# Patient Record
Sex: Female | Born: 1955
Health system: Southern US, Community
[De-identification: ages and names within clinical notes are randomized; demographics above are authoritative.]

## PROBLEM LIST (undated history)

## (undated) DIAGNOSIS — I10 Essential (primary) hypertension: Secondary | ICD-10-CM

## (undated) DIAGNOSIS — E785 Hyperlipidemia, unspecified: Secondary | ICD-10-CM

## (undated) DIAGNOSIS — F419 Anxiety disorder, unspecified: Secondary | ICD-10-CM

## (undated) DIAGNOSIS — K219 Gastro-esophageal reflux disease without esophagitis: Secondary | ICD-10-CM

## (undated) HISTORY — DX: Essential (primary) hypertension: I10

## (undated) HISTORY — DX: Anxiety disorder, unspecified: F41.9

## (undated) HISTORY — PX: ECTOPIC PREGNANCY SURGERY: SHX613

## (undated) HISTORY — PX: TUBAL LIGATION: SHX77

---

## 1997-05-15 ENCOUNTER — Other Ambulatory Visit: Admission: RE | Admit: 1997-05-15 | Discharge: 1997-05-15 | Payer: Self-pay | Admitting: Obstetrics & Gynecology

## 1998-11-25 ENCOUNTER — Other Ambulatory Visit: Admission: RE | Admit: 1998-11-25 | Discharge: 1998-11-25 | Payer: Self-pay | Admitting: Obstetrics & Gynecology

## 1999-12-01 ENCOUNTER — Other Ambulatory Visit: Admission: RE | Admit: 1999-12-01 | Discharge: 1999-12-01 | Payer: Self-pay | Admitting: Obstetrics & Gynecology

## 2001-05-30 ENCOUNTER — Other Ambulatory Visit: Admission: RE | Admit: 2001-05-30 | Discharge: 2001-05-30 | Payer: Self-pay | Admitting: Obstetrics & Gynecology

## 2002-11-28 ENCOUNTER — Other Ambulatory Visit: Admission: RE | Admit: 2002-11-28 | Discharge: 2002-11-28 | Payer: Self-pay | Admitting: Obstetrics & Gynecology

## 2004-04-28 ENCOUNTER — Other Ambulatory Visit: Admission: RE | Admit: 2004-04-28 | Discharge: 2004-04-28 | Payer: Self-pay | Admitting: Obstetrics & Gynecology

## 2008-08-01 ENCOUNTER — Ambulatory Visit: Payer: Self-pay | Admitting: Cardiology

## 2008-08-12 ENCOUNTER — Ambulatory Visit: Payer: Self-pay | Admitting: Cardiology

## 2008-08-13 ENCOUNTER — Encounter: Payer: Self-pay | Admitting: Physician Assistant

## 2008-08-19 ENCOUNTER — Inpatient Hospital Stay (HOSPITAL_BASED_OUTPATIENT_CLINIC_OR_DEPARTMENT_OTHER): Admission: RE | Admit: 2008-08-19 | Discharge: 2008-08-19 | Payer: Self-pay | Admitting: Cardiology

## 2008-08-19 ENCOUNTER — Ambulatory Visit: Payer: Self-pay | Admitting: Cardiology

## 2008-08-26 ENCOUNTER — Telehealth: Payer: Self-pay | Admitting: Cardiology

## 2008-11-12 DIAGNOSIS — I10 Essential (primary) hypertension: Secondary | ICD-10-CM | POA: Insufficient documentation

## 2008-11-12 DIAGNOSIS — R079 Chest pain, unspecified: Secondary | ICD-10-CM | POA: Insufficient documentation

## 2010-06-22 NOTE — Cardiovascular Report (Signed)
NAMENEGIN, HEGG NO.:  [id number]   MEDICAL RECORD NO.:  [id number]          PATIENT TYPE:  OIB   LOCATION:  1961                         FACILITY:  [facility]   PHYSICIAN:  Rollene Rotunda, MD, FACCDATE OF BIRTH:  Sep 09, 1955   DATE OF PROCEDURE:  08/19/2008  DATE OF DISCHARGE:  08/19/2008                            CARDIAC CATHETERIZATION   PROCEDURE:  Left heart catheterization/coronary arteriography.   INDICATIONS:  Evaluate the patient with chest discomfort and an abnormal  stress echo.   PROCEDURE NOTE:  Left heart catheterization was performed via the right  femoral artery.  The artery was cannulated using an anterior wall  puncture.  A #4-French arterial sheath was inserted via the modified  Seldinger technique.  Preformed Judkins and pigtail catheter were  utilized.  The patient tolerated the procedure well and left the lab in  stable condition.   RESULTS:  1. Hemodynamics:  LV 142/ 11, AO 142/68.  2. Coronaries:  Left main was normal.  The LAD was a large vessel      wrapping the apex.  It was tortuous in the apical segment.  It was      normal throughout its course.  There was a mid-diagonal which was      large and normal.  The circumflex in the AV groove was normal.      There was a mid obtuse marginal, which was large and normal.  The      right coronary artery was a large dominant vessel.  It was normal      throughout its course.  The PDA was moderate size and normal.  The      posterolateral was small and normal.  3. Left ventriculogram.  The left ventriculogram was obtained in the      RAO projection.  The EF was 70% with normal wall motion.  There was      ventricular ectopy.   CONCLUSION:  1. Normal coronary arteries.  2. Normal left ventricular function.   PLAN:  No further cardiovascular workup is suggested.  The patient will  continue with primary risk reduction.  She will see Dr. Dimas Aguas for any  further evaluation of nonanginal  chest discomfort.      Rollene Rotunda, MD, [facility]  Electronically Signed     JH/MEDQ  D:  08/19/2008  T:  08/19/2008  Job:  [id number]   cc:   Selinda Flavin

## 2010-06-22 NOTE — Assessment & Plan Note (Signed)
[facility]                          [facility] OFFICE NOTE   BRIANCA, FORTENBERRY                       MRN:          [id number]  DATE:08/12/2008                            DOB:          11-17-1955    PRIMARY CARE PHYSICIAN:  Dr. Selinda Flavin.   REASON FOR PRESENTATION:  Evaluate the patient with chest pain and an  abnormal stress test.   HISTORY OF PRESENT ILLNESS:  The patient reports that on July 17, 2008,  she had an episode of chest discomfort.  It was a shooting discomfort.  It lasted for only a few seconds.  She never had anything like this  before.  She did not describe associated nausea, vomiting, or  diaphoresis.  She did not have any palpitations, presyncope, or syncope.  She was not short of breath with this.  It happened at rest.  It went  away spontaneously.  She did see Dr. Dimas Aguas.  She elected to have a  stress test and had a stress echocardiogram.  This demonstrated 1-mm ST  depression in the inferior leads with exercise.  She had septal  hypokinesis suggestive of ischemia.  She exercised for 4 minutes and 49  seconds before achieving her target heart rate.   The patient otherwise says that she is active, but she does not  exercise.  She goes up and down stairs.  If she walks a hill in her  yard, she will get short of breath.  She does not get chest pressure, or  arm discomfort.  She does get some left neck discomfort which she has  had sporadically over the years.  However, this does not seem to happen  with exertion.   PAST MEDICAL HISTORY:  The patient has white coat hypertension.  She  has no history of dyslipidemia.  She has no history of diabetes.   PAST SURGICAL HISTORY:  Surgical treatment of ectopic pregnancy and  tubal ligation.   ALLERGIES/INTOLERANCES:  AMOXICILLIN, CECLOR, SULFA, KETAC, NORGESIC  FORTE.   MEDICATIONS:  Multivitamin, calcium, magnesium, fish oil, vitamin C,  aspirin 81 mg daily.   SOCIAL  HISTORY:  The patient is married.  She does not smoke cigarettes  or drink alcohol.   Family history is contributory for mother dying suddenly at age 57,  questionably of a heart attack.  She has a brother with a stroke at age  47.   REVIEW OF SYSTEMS:  As stated in the HPI, positive for decreased  hearing, occasional headaches, occasional reflux, eczema, anxiety.  Negative for all other systems.   PHYSICAL EXAMINATION:  GENERAL:  The patient is in no distress.  VITAL SIGNS:  Blood pressure 179/87, heart rate 88 and regular, weight  138 pounds.  HEENT:  Eyelids unremarkable, pupils round and reactive to light, fundi  not visualized, oral mucosa unremarkable.  NECK:  No jugular venous distention at 45 degrees, normal carotid  upstroke, brisk and symmetric, no bruits, no thyromegaly.  LYMPHATICS:  No cervical, axillary, or inguinal adenopathy.  LUNGS:  Clear to auscultation bilaterally.  BACK:  No costovertebral angle tenderness.  CHEST:  Unremarkable.  HEART:  PMI not displaced or sustained, S1 and S2 within normal limits,  no S3, no S4, no clicks, no rubs, no murmurs.  ABDOMEN:  Flat, positive bowel sounds, normal in frequency and pitch, no  bruits, no rebound, no guarding, no midline pulsatile mass, no  hepatomegaly, no splenomegaly.  SKIN:  No rashes, no nodules.  EXTREMITIES:  Pulses 2+ throughout, no edema, no cyanosis, no clubbing.  NEUROLOGIC:  Oriented to person, place, and time, cranial nerves II  through XII grossly intact, motor grossly intact.   ASSESSMENT AND PLAN:  1. Chest discomfort.  The patient's chest discomfort was atypical.      However, she did have an abnormal stress test.  She has family      history.  We discussed the possibilities for further evaluation to      include further noninvasive testing versus cardiac catheterization.      At this point given the abnormal response and her risk factors, I      would favor cardiac catheterization.  We discussed  at length the      risks and benefits.  She is going to consider this and call us back      with her decision.  2. Hypertension.  Her blood pressure is elevated.  I will ask her to      get a blood pressure cuff and keep an eye on this at home.  The      goal should be 140/90.  If not, she will need medical management.  3. Risk reduction.  I did look over her lipid profile.  Her HDL is 58      and an LDL of 95.  She will continue with dietary management.  4. Anxiety per Dr. Dimas Aguas.   FOLLOWUP:  We will see the patient back for further evaluation based on  her consideration of our discussion.     Rollene Rotunda, MD, [facility]  Electronically Signed    JH/MedQ  DD: 08/12/2008  DT: 08/12/2008  Job #: [id number]   cc:   Selinda Flavin

## 2014-05-11 ENCOUNTER — Encounter (HOSPITAL_COMMUNITY): Payer: Self-pay | Admitting: Cardiology

## 2014-05-11 ENCOUNTER — Observation Stay (HOSPITAL_COMMUNITY)
Admission: EM | Admit: 2014-05-11 | Discharge: 2014-05-12 | Disposition: A | Discharge status: Home / Self Care | Payer: BLUE CROSS/BLUE SHIELD | Attending: Emergency Medicine | Admitting: Emergency Medicine

## 2014-05-11 ENCOUNTER — Emergency Department (HOSPITAL_COMMUNITY): Payer: BLUE CROSS/BLUE SHIELD

## 2014-05-11 DIAGNOSIS — R42 Dizziness and giddiness: Secondary | ICD-10-CM | POA: Diagnosis not present

## 2014-05-11 DIAGNOSIS — Z79899 Other long term (current) drug therapy: Secondary | ICD-10-CM | POA: Diagnosis not present

## 2014-05-11 DIAGNOSIS — S060XAA Concussion with loss of consciousness status unknown, initial encounter: Secondary | ICD-10-CM | POA: Diagnosis present

## 2014-05-11 DIAGNOSIS — W01198A Fall on same level from slipping, tripping and stumbling with subsequent striking against other object, initial encounter: Secondary | ICD-10-CM | POA: Diagnosis not present

## 2014-05-11 DIAGNOSIS — I10 Essential (primary) hypertension: Secondary | ICD-10-CM | POA: Insufficient documentation

## 2014-05-11 DIAGNOSIS — R252 Cramp and spasm: Secondary | ICD-10-CM | POA: Diagnosis present

## 2014-05-11 DIAGNOSIS — S0990XA Unspecified injury of head, initial encounter: Secondary | ICD-10-CM | POA: Diagnosis not present

## 2014-05-11 DIAGNOSIS — Z7982 Long term (current) use of aspirin: Secondary | ICD-10-CM | POA: Insufficient documentation

## 2014-05-11 DIAGNOSIS — Z880 Allergy status to penicillin: Secondary | ICD-10-CM | POA: Diagnosis not present

## 2014-05-11 DIAGNOSIS — S060X9A Concussion with loss of consciousness of unspecified duration, initial encounter: Secondary | ICD-10-CM | POA: Diagnosis present

## 2014-05-11 DIAGNOSIS — Y9289 Other specified places as the place of occurrence of the external cause: Secondary | ICD-10-CM | POA: Diagnosis not present

## 2014-05-11 DIAGNOSIS — Y998 Other external cause status: Secondary | ICD-10-CM | POA: Insufficient documentation

## 2014-05-11 DIAGNOSIS — W19XXXA Unspecified fall, initial encounter: Secondary | ICD-10-CM

## 2014-05-11 DIAGNOSIS — Y9389 Activity, other specified: Secondary | ICD-10-CM | POA: Diagnosis not present

## 2014-05-11 DIAGNOSIS — F419 Anxiety disorder, unspecified: Secondary | ICD-10-CM | POA: Diagnosis not present

## 2014-05-11 DIAGNOSIS — R55 Syncope and collapse: Secondary | ICD-10-CM | POA: Diagnosis not present

## 2014-05-11 DIAGNOSIS — S0083XA Contusion of other part of head, initial encounter: Secondary | ICD-10-CM | POA: Diagnosis present

## 2014-05-11 HISTORY — DX: Hyperlipidemia, unspecified: E78.5

## 2014-05-11 LAB — CBC WITH DIFFERENTIAL/PLATELET
Basophils Absolute: 0 10*3/uL (ref 0.0–0.1)
Basophils Relative: 0 % (ref 0–1)
EOS PCT: 0 % (ref 0–5)
Eosinophils Absolute: 0 10*3/uL (ref 0.0–0.7)
HEMATOCRIT: 40.3 % (ref 36.0–46.0)
Hemoglobin: 13.8 g/dL (ref 12.0–15.0)
LYMPHS PCT: 10 % — AB (ref 12–46)
Lymphs Abs: 0.9 10*3/uL (ref 0.7–4.0)
MCH: 30.1 pg (ref 26.0–34.0)
MCHC: 34.2 g/dL (ref 30.0–36.0)
MCV: 87.8 fL (ref 78.0–100.0)
Monocytes Absolute: 0.6 10*3/uL (ref 0.1–1.0)
Monocytes Relative: 6 % (ref 3–12)
NEUTROS ABS: 7.9 10*3/uL — AB (ref 1.7–7.7)
NEUTROS PCT: 84 % — AB (ref 43–77)
Platelets: 178 10*3/uL (ref 150–400)
RBC: 4.59 MIL/uL (ref 3.87–5.11)
RDW: 12.6 % (ref 11.5–15.5)
WBC: 9.4 10*3/uL (ref 4.0–10.5)

## 2014-05-11 LAB — BASIC METABOLIC PANEL
Anion gap: 7 (ref 5–15)
BUN: 15 mg/dL (ref 6–23)
CHLORIDE: 108 mmol/L (ref 96–112)
CO2: 23 mmol/L (ref 19–32)
CREATININE: 0.59 mg/dL (ref 0.50–1.10)
Calcium: 8.6 mg/dL (ref 8.4–10.5)
Glucose, Bld: 112 mg/dL — ABNORMAL HIGH (ref 70–99)
POTASSIUM: 3.7 mmol/L (ref 3.5–5.1)
Sodium: 138 mmol/L (ref 135–145)

## 2014-05-11 LAB — TSH: TSH: 2.42 u[IU]/mL (ref 0.350–4.500)

## 2014-05-11 LAB — VITAMIN B12: Vitamin B-12: 677 pg/mL (ref 211–911)

## 2014-05-11 LAB — T4, FREE: Free T4: 1.42 ng/dL (ref 0.80–1.80)

## 2014-05-11 MED ORDER — ALUM & MAG HYDROXIDE-SIMETH 200-200-20 MG/5ML PO SUSP: 30.0000 mL | Freq: Four times a day (QID) | ORAL | Status: DC | PRN

## 2014-05-11 MED ORDER — VITAMIN C 500 MG PO TABS
500.0000 mg | ORAL_TABLET | Freq: Every day | ORAL | Status: DC
  Administered 2014-05-12: 500 mg via ORAL
  Filled 2014-05-11: qty 1

## 2014-05-11 MED ORDER — ACETAMINOPHEN 325 MG PO TABS: 650.0000 mg | ORAL_TABLET | Freq: Four times a day (QID) | ORAL | Status: DC | PRN

## 2014-05-11 MED ORDER — GUAIFENESIN-DM 100-10 MG/5ML PO SYRP
5.0000 mL | ORAL_SOLUTION | ORAL | Status: DC | PRN
Start: 2014-05-11 — End: 2014-05-12

## 2014-05-11 MED ORDER — POTASSIUM CHLORIDE IN NACL 20-0.9 MEQ/L-% IV SOLN
INTRAVENOUS | Status: DC
  Administered 2014-05-11 – 2014-05-12 (×2): via INTRAVENOUS

## 2014-05-11 MED ORDER — ONDANSETRON HCL 4 MG PO TABS: 4.0000 mg | ORAL_TABLET | Freq: Four times a day (QID) | ORAL | Status: DC | PRN

## 2014-05-11 MED ORDER — IBUPROFEN 800 MG PO TABS
400.0000 mg | ORAL_TABLET | Freq: Three times a day (TID) | ORAL | Status: DC
  Administered 2014-05-11 – 2014-05-12 (×4): 400 mg via ORAL
  Filled 2014-05-11 (×4): qty 1

## 2014-05-11 MED ORDER — ONDANSETRON HCL 4 MG/2ML IJ SOLN
4.0000 mg | Freq: Once | INTRAMUSCULAR | Status: AC
  Administered 2014-05-11: 4 mg via INTRAVENOUS
  Filled 2014-05-11: qty 2

## 2014-05-11 MED ORDER — SODIUM CHLORIDE 0.9 % IV BOLUS (SEPSIS)
1000.0000 mL | Freq: Once | INTRAVENOUS | Status: AC
  Administered 2014-05-11: 1000 mL via INTRAVENOUS

## 2014-05-11 MED ORDER — FAMOTIDINE 20 MG PO TABS
20.0000 mg | ORAL_TABLET | Freq: Two times a day (BID) | ORAL | Status: DC
  Administered 2014-05-11 – 2014-05-12 (×2): 20 mg via ORAL
  Filled 2014-05-11 (×2): qty 1

## 2014-05-11 MED ORDER — ACETAMINOPHEN 650 MG RE SUPP: 650.0000 mg | Freq: Four times a day (QID) | RECTAL | Status: DC | PRN

## 2014-05-11 MED ORDER — OMEGA-3-ACID ETHYL ESTERS 1 G PO CAPS
1.0000 g | ORAL_CAPSULE | Freq: Every day | ORAL | Status: DC
  Administered 2014-05-12: 1 g via ORAL
  Filled 2014-05-11: qty 1

## 2014-05-11 MED ORDER — MECLIZINE HCL 12.5 MG PO TABS
12.5000 mg | ORAL_TABLET | Freq: Three times a day (TID) | ORAL | Status: DC | PRN
Start: 2014-05-11 — End: 2014-05-12

## 2014-05-11 MED ORDER — MAGNESIUM OXIDE 400 (241.3 MG) MG PO TABS
200.0000 mg | ORAL_TABLET | Freq: Two times a day (BID) | ORAL | Status: DC
  Administered 2014-05-11 – 2014-05-12 (×2): 200 mg via ORAL
  Filled 2014-05-11 (×2): qty 1

## 2014-05-11 MED ORDER — ACETAMINOPHEN 500 MG PO TABS
1000.0000 mg | ORAL_TABLET | Freq: Once | ORAL | Status: AC
  Administered 2014-05-11: 1000 mg via ORAL
  Filled 2014-05-11: qty 2

## 2014-05-11 MED ORDER — HYDROCODONE-ACETAMINOPHEN 5-325 MG PO TABS: 1.0000 | ORAL_TABLET | ORAL | Status: DC | PRN

## 2014-05-11 MED ORDER — ACETAMINOPHEN 500 MG PO TABS
1000.0000 mg | ORAL_TABLET | Freq: Once | ORAL | Status: DC
  Filled 2014-05-11: qty 2

## 2014-05-11 MED ORDER — KETOROLAC TROMETHAMINE 30 MG/ML IJ SOLN
30.0000 mg | Freq: Once | INTRAMUSCULAR | Status: AC
  Administered 2014-05-11: 30 mg via INTRAVENOUS
  Filled 2014-05-11: qty 1

## 2014-05-11 MED ORDER — SODIUM CHLORIDE 0.9 % IV SOLN: INTRAVENOUS | Status: DC

## 2014-05-11 MED ORDER — ONDANSETRON HCL 4 MG/2ML IJ SOLN
4.0000 mg | Freq: Four times a day (QID) | INTRAMUSCULAR | Status: DC | PRN
  Administered 2014-05-11 (×2): 4 mg via INTRAVENOUS
  Filled 2014-05-11 (×2): qty 2

## 2014-05-11 MED ORDER — LORAZEPAM 2 MG/ML IJ SOLN
1.0000 mg | Freq: Once | INTRAMUSCULAR | Status: AC
  Administered 2014-05-11: 1 mg via INTRAVENOUS
  Filled 2014-05-11: qty 1

## 2014-05-11 MED ORDER — DIAZEPAM 2 MG PO TABS: 2.0000 mg | ORAL_TABLET | Freq: Three times a day (TID) | ORAL | Status: DC | PRN

## 2014-05-11 NOTE — H&P (Signed)
[facility] History and Physical  Annell Canty QQP:[id number] DOB: 05-19-1955 DOA: 05/11/2014  Referring physician: ED physician, Dr. Lacinda Axon PCP: Rory Percy, MD   Chief Complaint: Patient passed out at home.  HPI: Barbara Wiley is a 59 y.o. female with a history of whitecoat hypertension, chest pain with a negative cardiac catheterization in either 2009 or 2010, and chronic anxiety. She also has a history of leg cramps. She presents to the emergency department after apparently passing out at home, falling, and bruising her head and her face. The history is being provided by the patient and her husband. Accordingly, the patient had been under additional stress over the past 24 hours because of hosting a baby shower and grieving from the death of a recent close cousin. Reportedly, she received very little sleep overnight, no more than 2 hours. Early this morning, she complained of severe left leg cramping. She was able to walk it out a little and tried to fall back asleep. However, the cramping happened again but it was more severe. When she tried to get up again, she apparently lost consciousness and fell on her right side. Reportedly, there was no warning and she just fell without any prior warning. According to her family, she laid unconscious for 1-2 minutes before coming to spontaneously. When she came to, she did complain of right facial pain and right-sided headache along with neck pain and shoulder pain. There was no reported seizure activity or incontinence of bladder or bowels.  Prior to her falling/syncopal episode, her review of systems was positive for occasional leg cramping and recent lack of sleep as well as recent stress/anxiety. Her review of systems was negative for chest pain, shortness of breath, fever, chills, nausea, vomiting, diarrhea, abdominal pain, pain with urination, bloody stools, melena, or swelling in her legs.  In the ED, she did complain of some nausea and  dizziness when nursing ambulated her. In the ED, she was afebrile and hemodynamically stable. Her lab data were unremarkable. CT of her head, face, and cervical spine revealed no traumatic or acute findings. She is being admitted for observation and further evaluation.     Review of Systems:  As above in history present illness.  Past Medical History  Diagnosis Date  . Hypertension   . Chest pain   . Anxiety   . Hyperlipidemia    Past Surgical History  Procedure Laterality Date  . Ectopic pregnancy surgery    . Tubal ligation     Social History: The patient is married. She lives with her husband in [place]. She has 2 children. She denies tobacco, alcohol, and illicit drug use.   Allergies  Allergen Reactions  . Keflex [Cephalexin] Nausea Only  . Other     duragesic patch causes high heart rate.   . Sulfa Antibiotics Nausea Only  . Amoxicillin Rash  . Ceclor [Cefaclor] Rash   Family history: Her mother died of a heart attack at 64 years of age. Her father died of coffee occasions from Alzheimer's.   Prior to Admission medications   Medication Sig Start Date End Date Taking? Authorizing Provider  aspirin EC 81 MG tablet Take 81 mg by mouth daily.   Yes Historical Provider, MD  MAGNESIUM OXIDE PO Take 1 tablet by mouth daily.   Yes Historical Provider, MD  Omega-3 Fatty Acids (FISH OIL PO) Take 1 capsule by mouth daily.   Yes Historical Provider, MD  vitamin C (ASCORBIC ACID) 500 MG tablet Take 500 mg by mouth  daily.   Yes Historical Provider, MD   Physical Exam: Filed Vitals:   05/11/14 1230 05/11/14 1300 05/11/14 1430 05/11/14 1501  BP: 125/72 124/61 135/65 133/72  Pulse: 74 73 72 85  Temp:    98.1 F (36.7 C)  TempSrc:    Oral  Resp: 14 13 12 16   SpO2: 98% 98% 99% 100%    Wt Readings from Last 3 Encounters:  No data found for Wt    General:  Appears calm and comfortable; 59 year old Caucasian woman sitting up, in no acute distress. Head/face: There is a small  hematoma on her right for head above her right eye with some ecchymosis and mild tenderness. There is a facial abrasion and mild ecchymosis right cheek/maxillary area-no active bleeding or drainage. Eyes: PERRL, small hematoma above right right for head extending over to above the right orbit; no conjunctival erythema or scleral icterus. Extraocular was our intact. No nystagmus. ENT: grossly normal hearing, lips & tongue; mucous membranes are mildly dry no posterior erythema or exudates. Neck: no LAD, masses or thyromegaly Cardiovascular: RRR, no m/r/g. No LE edema. Telemetry: SR, no arrhythmias  Respiratory: CTA bilaterally, no w/r/r. Normal respiratory effort. Abdomen: soft, ntnd; positive bowel sounds; no distention, masses, or hepatosplenomegaly. Skin: Right forehead hematoma/ecchymosis above the right orbit; facial abrasion right cheek; fairly good skin turgor otherwise. Musculoskeletal: grossly normal tone BUE/BLE Psychiatric: She has a flat affect and appears mildly sedated (status post IV Ativan). Her speech is otherwise clear. Neurologic: She is oriented 3. Cranial nerves II through XII are intact. No evidence of nystagmus. Strength is 5 over 5 in the sitting position. With observed ambulation, she appears a little unsteady on her feet, but no ataxia.           Labs on Admission:  Basic Metabolic Panel:  Recent Labs Lab 05/11/14 1038  NA 138  K 3.7  CL 108  CO2 23  GLUCOSE 112*  BUN 15  CREATININE 0.59  CALCIUM 8.6   Liver Function Tests: No results for input(s): AST, ALT, ALKPHOS, BILITOT, PROT, ALBUMIN in the last 168 hours. No results for input(s): LIPASE, AMYLASE in the last 168 hours. No results for input(s): AMMONIA in the last 168 hours. CBC:  Recent Labs Lab 05/11/14 1038  WBC 9.4  NEUTROABS 7.9*  HGB 13.8  HCT 40.3  MCV 87.8  PLT 178   Cardiac Enzymes: No results for input(s): CKTOTAL, CKMB, CKMBINDEX, TROPONINI in the last 168 hours.  BNP (last 3  results) No results for input(s): BNP in the last 8760 hours.  ProBNP (last 3 results) No results for input(s): PROBNP in the last 8760 hours.  CBG: No results for input(s): GLUCAP in the last 168 hours.  Radiological Exams on Admission: Ct Head Wo Contrast  05/11/2014   CLINICAL DATA:  [place] at home striking the right-sided face on the floor.  EXAM: CT HEAD WITHOUT CONTRAST  CT MAXILLOFACIAL WITHOUT CONTRAST  CT CERVICAL SPINE WITHOUT CONTRAST  TECHNIQUE: Multidetector CT imaging of the head, cervical spine, and maxillofacial structures were performed using the standard protocol without intravenous contrast. Multiplanar CT image reconstructions of the cervical spine and maxillofacial structures were also generated.  COMPARISON:  None.  FINDINGS: CT HEAD FINDINGS  The brain has a normal appearance without evidence of atrophy, infarction, mass lesion, hemorrhage, hydrocephalus or extra-axial collection. The calvarium is unremarkable. The paranasal sinuses, middle ears and mastoids are clear.  CT MAXILLOFACIAL FINDINGS  No facial fracture. No traumatic fluid in the  sinuses. Chronic nasal septal deviation towards the left.  CT CERVICAL SPINE FINDINGS  No traumatic malalignment. No fracture. No soft tissue swelling. There is facet arthropathy on the right at C4-5 and C5-6. There is facet arthropathy on the left at C4-5, C5-6 and C7-T1. There is mild spondylosis at C4-5, C5-6 and C6-7.  IMPRESSION: Head CT:  Normal.  Facial CT:  Normal.  Cervical spine CT: No traumatic finding. Ordinary degenerative changes.   Electronically Signed   By: Nelson Chimes M.D.   On: 05/11/2014 09:58   Ct Cervical Spine Wo Contrast  05/11/2014   CLINICAL DATA:  [place] at home striking the right-sided face on the floor.  EXAM: CT HEAD WITHOUT CONTRAST  CT MAXILLOFACIAL WITHOUT CONTRAST  CT CERVICAL SPINE WITHOUT CONTRAST  TECHNIQUE: Multidetector CT imaging of the head, cervical spine, and maxillofacial structures were performed using  the standard protocol without intravenous contrast. Multiplanar CT image reconstructions of the cervical spine and maxillofacial structures were also generated.  COMPARISON:  None.  FINDINGS: CT HEAD FINDINGS  The brain has a normal appearance without evidence of atrophy, infarction, mass lesion, hemorrhage, hydrocephalus or extra-axial collection. The calvarium is unremarkable. The paranasal sinuses, middle ears and mastoids are clear.  CT MAXILLOFACIAL FINDINGS  No facial fracture. No traumatic fluid in the sinuses. Chronic nasal septal deviation towards the left.  CT CERVICAL SPINE FINDINGS  No traumatic malalignment. No fracture. No soft tissue swelling. There is facet arthropathy on the right at C4-5 and C5-6. There is facet arthropathy on the left at C4-5, C5-6 and C7-T1. There is mild spondylosis at C4-5, C5-6 and C6-7.  IMPRESSION: Head CT:  Normal.  Facial CT:  Normal.  Cervical spine CT: No traumatic finding. Ordinary degenerative changes.   Electronically Signed   By: Nelson Chimes M.D.   On: 05/11/2014 09:58   Ct Maxillofacial Wo Cm  05/11/2014   CLINICAL DATA:  [place] at home striking the right-sided face on the floor.  EXAM: CT HEAD WITHOUT CONTRAST  CT MAXILLOFACIAL WITHOUT CONTRAST  CT CERVICAL SPINE WITHOUT CONTRAST  TECHNIQUE: Multidetector CT imaging of the head, cervical spine, and maxillofacial structures were performed using the standard protocol without intravenous contrast. Multiplanar CT image reconstructions of the cervical spine and maxillofacial structures were also generated.  COMPARISON:  None.  FINDINGS: CT HEAD FINDINGS  The brain has a normal appearance without evidence of atrophy, infarction, mass lesion, hemorrhage, hydrocephalus or extra-axial collection. The calvarium is unremarkable. The paranasal sinuses, middle ears and mastoids are clear.  CT MAXILLOFACIAL FINDINGS  No facial fracture. No traumatic fluid in the sinuses. Chronic nasal septal deviation towards the left.  CT  CERVICAL SPINE FINDINGS  No traumatic malalignment. No fracture. No soft tissue swelling. There is facet arthropathy on the right at C4-5 and C5-6. There is facet arthropathy on the left at C4-5, C5-6 and C7-T1. There is mild spondylosis at C4-5, C5-6 and C6-7.  IMPRESSION: Head CT:  Normal.  Facial CT:  Normal.  Cervical spine CT: No traumatic finding. Ordinary degenerative changes.   Electronically Signed   By: Nelson Chimes M.D.   On: 05/11/2014 09:58    EKG: Independently reviewed. Normal sinus rhythm with some artifact, but no significant ST or T-wave abdomen allergies.  Assessment/Plan Active Problems:   Syncope and collapse   Mild concussion   Facial contusion   Leg cramps   Orthostatic dizziness   1. Syncope and collapse. The etiology was likely pain mediation from the severe leg  cramping coupled with orthostatic changes with contribution from a lack of sleep. The patient has no significant cardiopulmonary history. Her EKG is unremarkable. She was mildly orthostatic per vital signs. Therefore, it will be reasonable to admit her for observation. I doubt that an extensive evaluation with additional radiographic studies or additional cardiopulmonary studies would yield much. Therefore, she will be monitored on telemetry. Neurological checks will be ordered every 4 hours. She will be gently hydrated. Will check a TSH and vitamin B12 level to rule out deficiency. We'll check orthostatic vital signs tomorrow morning. 2. Probable mild concussion with facial contusion/small hematoma, nausea, and dizziness. CT scan of her head, face, and cervical spine were not acute. The patient was given IV Toradol in the ED. She will be treated supportively and symptomatically with scheduled ibuprofen 3 times a day, as needed hydrocodone, as needed Zofran, and as needed meclizine. Cold compresses will be ordered for the small hematoma above her right orbit. 3. Leg cramps. She has no complaints currently. She was  given IV Ativan in the ED. We'll treat her cramps with as needed diazepam and scheduled magnesium oxide.    Code Status: Full code DVT Prophylaxis: SCDs Family Communication: Discussed with her husband Disposition Plan: Anticipate discharge to home tomorrow.   Time spent: One hour  [facility] Pager [phone]

## 2014-05-11 NOTE — ED Notes (Signed)
Patient c/o nausea, EDP made aware-verbal order given.

## 2014-05-11 NOTE — ED Notes (Signed)
EMS transported pt here for a fall.    Daughter found pt in the floor.  Pt states she has been up all night with anxiety,  States "I may have passed out".  Pt only c/o is neck pain.  Pt refused  c-collar ,  Spinal immobilization . Also abrasions right side of face.

## 2014-05-11 NOTE — ED Provider Notes (Signed)
CSN: [id number]     Arrival date & time 05/11/14  0803 History  This chart was scribed for Nat Christen, MD by [facility], ED Scribe. The patient was seen in [place] and the patient's care was started at 8:23 AM.  Chief Complaint  Patient presents with  . Fall   Patient is a 60 y.o. female presenting with fall. The history is provided by the patient. No language interpreter was used.  Fall   HPI Comments: Barbara Wiley is a 59 y.o. female with a history of hypertension and anxiety brought in by ambulance, who presents to the Emergency Department complaining of a fall. Her daughter states she was having a cramp in her left leg and she tried to stand, felt light headed and fell hitting her head on the rug. According to the daughter, the pt was unconscious for approximately 2 min while she was calling EMS. Pt states she only slept two hours due to family issues (baby shower, death in family).  No previous cardiac hx   Past Medical History  Diagnosis Date  . Hypertension   . Chest pain   . Anxiety    Past Surgical History  Procedure Laterality Date  . Ectopic pregnancy surgery    . Tubal ligation     History reviewed. No pertinent family history. History  Substance Use Topics  . Smoking status: Never Smoker   . Smokeless tobacco: Not on file  . Alcohol Use: No   OB History    No data available     Review of Systems  Skin: Positive for wound.  Neurological: Positive for syncope.  A complete 10 system review of systems was obtained and all systems are negative except as noted in the HPI and PMH.  Allergies  Keflex; Other; Sulfa antibiotics; Amoxicillin; and Ceclor  Home Medications   Prior to Admission medications   Medication Sig Start Date End Date Taking? Authorizing Provider  aspirin EC 81 MG tablet Take 81 mg by mouth daily.   Yes Historical Provider, MD  MAGNESIUM OXIDE PO Take 1 tablet by mouth daily.   Yes Historical Provider, MD  Omega-3 Fatty Acids (FISH  OIL PO) Take 1 capsule by mouth daily.   Yes Historical Provider, MD  vitamin C (ASCORBIC ACID) 500 MG tablet Take 500 mg by mouth daily.   Yes Historical Provider, MD   BP 124/61 mmHg  Pulse 73  Temp(Src) 97.9 F (36.6 C) (Oral)  Resp 13  SpO2 98% Physical Exam  Constitutional: She is oriented to person, place, and time. She appears well-developed and well-nourished.  HENT:  Head: Normocephalic and atraumatic.  Ecchymosis on right forehead. Abrasion on right periorbital area  Eyes: Conjunctivae and EOM are normal. Pupils are equal, round, and reactive to light.  Neck: Normal range of motion. Neck supple.  Tenderness over the posterior neck.  Cardiovascular: Normal rate and regular rhythm.   Pulmonary/Chest: Effort normal and breath sounds normal.  Abdominal: Soft. Bowel sounds are normal.  Musculoskeletal: Normal range of motion.  Neurological: She is alert and oriented to person, place, and time.  Skin: Skin is warm and dry.  Psychiatric: She has a normal mood and affect. Her behavior is normal.  Nursing note and vitals reviewed.   ED Course  Procedures  DIAGNOSTIC STUDIES: Oxygen Saturation is 99% on room air, normal by my interpretation.    COORDINATION OF CARE: 8:30 AM: Will order and  X-ray of head and neck, basic blood work and an IV.  Pt and family agreed to plan.  Results for orders placed or performed during the hospital encounter of 40/81/44  Basic metabolic panel  Result Value Ref Range   Sodium 138 135 - 145 mmol/L   Potassium 3.7 3.5 - 5.1 mmol/L   Chloride 108 96 - 112 mmol/L   CO2 23 19 - 32 mmol/L   Glucose, Bld 112 (H) 70 - 99 mg/dL   BUN 15 6 - 23 mg/dL   Creatinine, Ser 0.59 0.50 - 1.10 mg/dL   Calcium 8.6 8.4 - 10.5 mg/dL   GFR calc non Af Amer >90 >90 mL/min   GFR calc Af Amer >90 >90 mL/min   Anion gap 7 5 - 15  CBC with Differential/Platelet  Result Value Ref Range   WBC 9.4 4.0 - 10.5 K/uL   RBC 4.59 3.87 - 5.11 MIL/uL   Hemoglobin 13.8  12.0 - 15.0 g/dL   HCT 40.3 36.0 - 46.0 %   MCV 87.8 78.0 - 100.0 fL   MCH 30.1 26.0 - 34.0 pg   MCHC 34.2 30.0 - 36.0 g/dL   RDW 12.6 11.5 - 15.5 %   Platelets 178 150 - 400 K/uL   Neutrophils Relative % 84 (H) 43 - 77 %   Neutro Abs 7.9 (H) 1.7 - 7.7 K/uL   Lymphocytes Relative 10 (L) 12 - 46 %   Lymphs Abs 0.9 0.7 - 4.0 K/uL   Monocytes Relative 6 3 - 12 %   Monocytes Absolute 0.6 0.1 - 1.0 K/uL   Eosinophils Relative 0 0 - 5 %   Eosinophils Absolute 0.0 0.0 - 0.7 K/uL   Basophils Relative 0 0 - 1 %   Basophils Absolute 0.0 0.0 - 0.1 K/uL   Ct Head Wo Contrast  05/11/2014   CLINICAL DATA:  [place] at home striking the right-sided face on the floor.  EXAM: CT HEAD WITHOUT CONTRAST  CT MAXILLOFACIAL WITHOUT CONTRAST  CT CERVICAL SPINE WITHOUT CONTRAST  TECHNIQUE: Multidetector CT imaging of the head, cervical spine, and maxillofacial structures were performed using the standard protocol without intravenous contrast. Multiplanar CT image reconstructions of the cervical spine and maxillofacial structures were also generated.  COMPARISON:  None.  FINDINGS: CT HEAD FINDINGS  The brain has a normal appearance without evidence of atrophy, infarction, mass lesion, hemorrhage, hydrocephalus or extra-axial collection. The calvarium is unremarkable. The paranasal sinuses, middle ears and mastoids are clear.  CT MAXILLOFACIAL FINDINGS  No facial fracture. No traumatic fluid in the sinuses. Chronic nasal septal deviation towards the left.  CT CERVICAL SPINE FINDINGS  No traumatic malalignment. No fracture. No soft tissue swelling. There is facet arthropathy on the right at C4-5 and C5-6. There is facet arthropathy on the left at C4-5, C5-6 and C7-T1. There is mild spondylosis at C4-5, C5-6 and C6-7.  IMPRESSION: Head CT:  Normal.  Facial CT:  Normal.  Cervical spine CT: No traumatic finding. Ordinary degenerative changes.   Electronically Signed   By: Nelson Chimes M.D.   On: 05/11/2014 09:58   Ct Cervical  Spine Wo Contrast  05/11/2014   CLINICAL DATA:  [place] at home striking the right-sided face on the floor.  EXAM: CT HEAD WITHOUT CONTRAST  CT MAXILLOFACIAL WITHOUT CONTRAST  CT CERVICAL SPINE WITHOUT CONTRAST  TECHNIQUE: Multidetector CT imaging of the head, cervical spine, and maxillofacial structures were performed using the standard protocol without intravenous contrast. Multiplanar CT image reconstructions of the cervical spine and maxillofacial structures were also generated.  COMPARISON:  None.  FINDINGS: CT HEAD FINDINGS  The brain has a normal appearance without evidence of atrophy, infarction, mass lesion, hemorrhage, hydrocephalus or extra-axial collection. The calvarium is unremarkable. The paranasal sinuses, middle ears and mastoids are clear.  CT MAXILLOFACIAL FINDINGS  No facial fracture. No traumatic fluid in the sinuses. Chronic nasal septal deviation towards the left.  CT CERVICAL SPINE FINDINGS  No traumatic malalignment. No fracture. No soft tissue swelling. There is facet arthropathy on the right at C4-5 and C5-6. There is facet arthropathy on the left at C4-5, C5-6 and C7-T1. There is mild spondylosis at C4-5, C5-6 and C6-7.  IMPRESSION: Head CT:  Normal.  Facial CT:  Normal.  Cervical spine CT: No traumatic finding. Ordinary degenerative changes.   Electronically Signed   By: Nelson Chimes M.D.   On: 05/11/2014 09:58   Ct Maxillofacial Wo Cm  05/11/2014   CLINICAL DATA:  [place] at home striking the right-sided face on the floor.  EXAM: CT HEAD WITHOUT CONTRAST  CT MAXILLOFACIAL WITHOUT CONTRAST  CT CERVICAL SPINE WITHOUT CONTRAST  TECHNIQUE: Multidetector CT imaging of the head, cervical spine, and maxillofacial structures were performed using the standard protocol without intravenous contrast. Multiplanar CT image reconstructions of the cervical spine and maxillofacial structures were also generated.  COMPARISON:  None.  FINDINGS: CT HEAD FINDINGS  The brain has a normal appearance without  evidence of atrophy, infarction, mass lesion, hemorrhage, hydrocephalus or extra-axial collection. The calvarium is unremarkable. The paranasal sinuses, middle ears and mastoids are clear.  CT MAXILLOFACIAL FINDINGS  No facial fracture. No traumatic fluid in the sinuses. Chronic nasal septal deviation towards the left.  CT CERVICAL SPINE FINDINGS  No traumatic malalignment. No fracture. No soft tissue swelling. There is facet arthropathy on the right at C4-5 and C5-6. There is facet arthropathy on the left at C4-5, C5-6 and C7-T1. There is mild spondylosis at C4-5, C5-6 and C6-7.  IMPRESSION: Head CT:  Normal.  Facial CT:  Normal.  Cervical spine CT: No traumatic finding. Ordinary degenerative changes.   Electronically Signed   By: Nelson Chimes M.D.   On: 05/11/2014 09:58    MDM   Final diagnoses:  Fall  Syncope, unspecified syncope type   Syncopal spell at home, ? vasovagal in etiology.  Screening labs, EKG,  CT head/max-facial/cervical spine all normal.   I personally performed the services described in this documentation, which was scribed in my presence. The recorded information has been reviewed and is accurate.     Nat Christen, MD 05/11/14 [phone]

## 2014-05-11 NOTE — ED Notes (Signed)
Pt made it a few steps out the room and was feeling dizzy and nausea

## 2014-05-11 NOTE — ED Notes (Signed)
Pt reported dizziness while attempting to ambulate in hall.  Returned pt to bed and added orders for orthostatic vitals.

## 2014-05-11 NOTE — ED Notes (Signed)
telfa dressing to right cheek.

## 2014-05-11 NOTE — ED Notes (Signed)
Report given to floor. Dr Caryn Section in room to assess patient prior to transfer to floor.

## 2014-05-12 DIAGNOSIS — R55 Syncope and collapse: Secondary | ICD-10-CM | POA: Diagnosis not present

## 2014-05-12 DIAGNOSIS — R252 Cramp and spasm: Secondary | ICD-10-CM | POA: Diagnosis not present

## 2014-05-12 DIAGNOSIS — S0083XA Contusion of other part of head, initial encounter: Secondary | ICD-10-CM | POA: Diagnosis not present

## 2014-05-12 DIAGNOSIS — S060X9A Concussion with loss of consciousness of unspecified duration, initial encounter: Secondary | ICD-10-CM | POA: Diagnosis not present

## 2014-05-12 LAB — CBC
HEMATOCRIT: 38.1 % (ref 36.0–46.0)
HEMOGLOBIN: 12.8 g/dL (ref 12.0–15.0)
MCH: 29.8 pg (ref 26.0–34.0)
MCHC: 33.6 g/dL (ref 30.0–36.0)
MCV: 88.6 fL (ref 78.0–100.0)
Platelets: 190 10*3/uL (ref 150–400)
RBC: 4.3 MIL/uL (ref 3.87–5.11)
RDW: 12.9 % (ref 11.5–15.5)
WBC: 7 10*3/uL (ref 4.0–10.5)

## 2014-05-12 LAB — BASIC METABOLIC PANEL
ANION GAP: 6 (ref 5–15)
BUN: 8 mg/dL (ref 6–23)
CHLORIDE: 110 mmol/L (ref 96–112)
CO2: 25 mmol/L (ref 19–32)
Calcium: 8.6 mg/dL (ref 8.4–10.5)
Creatinine, Ser: 0.64 mg/dL (ref 0.50–1.10)
GFR calc Af Amer: >90 mL/min (ref >90)
GFR calc non Af Amer: >90 mL/min (ref >90)
Glucose, Bld: 88 mg/dL (ref 70–99)
Potassium: 3.6 mmol/L (ref 3.5–5.1)
SODIUM: 141 mmol/L (ref 135–145)

## 2014-05-12 MED ORDER — IBUPROFEN 400 MG PO TABS: 400.0000 mg | ORAL_TABLET | Freq: Three times a day (TID) | ORAL | Status: DC

## 2014-05-12 MED ORDER — ONDANSETRON HCL 4 MG PO TABS: 4.0000 mg | ORAL_TABLET | Freq: Three times a day (TID) | ORAL | Status: DC | PRN

## 2014-05-12 NOTE — Care Management Note (Signed)
    Page 1 of 1   05/12/2014     1:50:46 PM CARE MANAGEMENT NOTE 05/12/2014  Patient:  Barbara Wiley,Barbara Wiley   Account Number:  [id number]  Date Initiated:  05/12/2014  Documentation initiated by:  Jolene Provost  Subjective/Objective Assessment:   Pt is from home, lives with husband. Pt indepdent at baseline. No DME's or [place] services prior to admission. Pt discharging home today with self care. No CM needs.     Action/Plan:   Anticipated DC Date:  05/12/2014   Anticipated DC Plan:  [place]  CM consult      Choice offered to / List presented to:             Status of service:  Completed, signed off Medicare Important Message given?   (If response is "NO", the following Medicare IM given date fields will be blank) Date Medicare IM given:   Medicare IM given by:   Date Additional Medicare IM given:   Additional Medicare IM given by:    Discharge Disposition:  HOME/SELF CARE  Per UR Regulation:    If discussed at Long Length of Stay Meetings, dates discussed:    Comments:  05/12/2014 [place], RN, MSN, CM

## 2014-05-12 NOTE — Progress Notes (Signed)
Patient was discharged home today.  Patient was given discharge instructions, prescriptions, and care notes.  Patient  verbalized understanding with no complaints or concerns voiced at this time.  IV was removed with catheter intact, no bleeding or complications.  Patient left unit in stable condition by a staff member in a wheelchair. 

## 2014-05-12 NOTE — Progress Notes (Signed)
Patient ambulated in hallway 300 ft with staff member.  Patient tolerated well with no complaints of dizziness or HA.

## 2014-05-12 NOTE — Discharge Summary (Signed)
Physician Discharge Summary  Barbara Wiley RWE:[id number] DOB: March 31, 1955 DOA: 05/11/2014  PCP: Rory Percy, MD  Admit date: 05/11/2014 Discharge date: 05/12/2014  Time spent: 40 minutes  Recommendations for Outpatient Follow-up:  1. Has appointment with PCP 05/19/14 evaluated volume status, resolution of contusion 2. Recommended to avoid driving for 1 week, avoid dehydration and clean abrasion as directed.    Discharge Diagnoses:  Active Problems:   Syncope and collapse   Mild concussion   Facial contusion   Leg cramps   Orthostatic dizziness   Discharge Condition: stable  Diet recommendation: regular  Filed Weights   05/11/14 1610  Weight: 65.409 kg (144 lb 3.2 oz)    History of present illness:  Barbara Wiley is a 59 y.o. female with a history of whitecoat hypertension, chest pain with a negative cardiac catheterization in either 2009 or 2010, and chronic anxiety. She also has a history of leg cramps. She presented to the emergency department on 05/11/14 after apparently passing out at home, falling, and bruising her head and her face. The history provided by the patient and her husband. Accordingly, the patient had been under additional stress over the past 24 hours because of hosting a baby shower and grieving from the death of a recent close cousin. Reportedly, she received very little sleep the night prior, no more than 2 hours. Early in morning, she complained of severe left leg cramping. She was able to walk it out a little and tried to fall back asleep. However, the cramping happened again but it was more severe. When she tried to get up again, she apparently lost consciousness and fell on her right side. Reportedly, there was no warning and she just fell without any prior warning. According to her family, she laid unconscious for 1-2 minutes before coming to spontaneously. When she came to, she did complain of right facial pain and right-sided headache along with neck pain and  shoulder pain. There was no reported seizure activity or incontinence of bladder or bowels.  Hospital Course:  1. Syncope and collapse. The etiology was likely pain mediation from the severe leg cramping coupled with orthostatic changes with contribution from a lack of sleep. The patient has no significant cardiopulmonary history. Her EKG was unremarkable. She was mildly orthostatic per vital signs. She was admitted for observation and provided with gentle IV hydration. During hospitalization there were no events on telemetry, neuro checks within limits of normal, TSH and vitamin B12 within limits of normal. Not orthostatic on day of discharge. She ambulated in hall with steady gait, no complaints dizziness, pain.  2. Probable mild concussion with facial contusion/small hematoma, nausea, and dizziness. CT scan of her head, face, and cervical spine were not acute. The patient was given IV Toradol in the ED. She was treated supportively and symptomatically with scheduled ibuprofen 3 times a day, as needed hydrocodone, as needed Zofran, and as needed meclizine. She was provided with Cold compresses will be ordered for the small hematoma above her right orbit. At discharge no dizziness or nausea. She was advised to avoid dehydration, clean abraision gently and cover and not to drive for 1 week.  3. Leg cramps. She had no further complaints leg cramps.  She was given IV Ativan in the ED.   Procedures:  none  Consultations:  none  Discharge Exam: Filed Vitals:   05/12/14 0600  BP:   Pulse:   Temp: 98.2 F (36.8 C)  Resp: 20    General: well nourished Cardiovascular:  RRR No MGR No LE edema Respiratory: normal effort BS clear bilaterally no wheeze Neuro: alert and oriented x3. Gait steady.  Skin: right forehead with ecchymosis and hematoma around right orbit, mild swelling right cheek, abrasion right side of chin.  Discharge Instructions    Current Discharge Medication List    START  taking these medications   Details  ibuprofen (ADVIL,MOTRIN) 400 MG tablet Take 1 tablet (400 mg total) by mouth 3 (three) times daily. Qty: 30 tablet, Refills: 0    ondansetron (ZOFRAN) 4 MG tablet Take 1 tablet (4 mg total) by mouth every 8 (eight) hours as needed for nausea. Qty: 20 tablet, Refills: 0      CONTINUE these medications which have NOT CHANGED   Details  aspirin EC 81 MG tablet Take 81 mg by mouth daily.    MAGNESIUM OXIDE PO Take 1 tablet by mouth daily.    Omega-3 Fatty Acids (FISH OIL PO) Take 1 capsule by mouth daily.    vitamin C (ASCORBIC ACID) 500 MG tablet Take 500 mg by mouth daily.       Allergies  Allergen Reactions  . Keflex [Cephalexin] Nausea Only  . Other     duragesic patch causes high heart rate.   . Sulfa Antibiotics Nausea Only  . Amoxicillin Rash  . Ceclor [Cefaclor] Rash   Follow-up Information    Follow up with Rory Percy, MD On 05/19/2014.   Specialty:  Family Medicine   Why:  at 3:00 pm   Contact information:   [place] [id number] [phone]        The results of significant diagnostics from this hospitalization (including imaging, microbiology, ancillary and laboratory) are listed below for reference.    Significant Diagnostic Studies: Ct Head Wo Contrast  05/11/2014   CLINICAL DATA:  [place] at home striking the right-sided face on the floor.  EXAM: CT HEAD WITHOUT CONTRAST  CT MAXILLOFACIAL WITHOUT CONTRAST  CT CERVICAL SPINE WITHOUT CONTRAST  TECHNIQUE: Multidetector CT imaging of the head, cervical spine, and maxillofacial structures were performed using the standard protocol without intravenous contrast. Multiplanar CT image reconstructions of the cervical spine and maxillofacial structures were also generated.  COMPARISON:  None.  FINDINGS: CT HEAD FINDINGS  The brain has a normal appearance without evidence of atrophy, infarction, mass lesion, hemorrhage, hydrocephalus or extra-axial collection. The calvarium is  unremarkable. The paranasal sinuses, middle ears and mastoids are clear.  CT MAXILLOFACIAL FINDINGS  No facial fracture. No traumatic fluid in the sinuses. Chronic nasal septal deviation towards the left.  CT CERVICAL SPINE FINDINGS  No traumatic malalignment. No fracture. No soft tissue swelling. There is facet arthropathy on the right at C4-5 and C5-6. There is facet arthropathy on the left at C4-5, C5-6 and C7-T1. There is mild spondylosis at C4-5, C5-6 and C6-7.  IMPRESSION: Head CT:  Normal.  Facial CT:  Normal.  Cervical spine CT: No traumatic finding. Ordinary degenerative changes.   Electronically Signed   By: Nelson Chimes M.D.   On: 05/11/2014 09:58   Ct Cervical Spine Wo Contrast  05/11/2014   CLINICAL DATA:  [place] at home striking the right-sided face on the floor.  EXAM: CT HEAD WITHOUT CONTRAST  CT MAXILLOFACIAL WITHOUT CONTRAST  CT CERVICAL SPINE WITHOUT CONTRAST  TECHNIQUE: Multidetector CT imaging of the head, cervical spine, and maxillofacial structures were performed using the standard protocol without intravenous contrast. Multiplanar CT image reconstructions of the cervical spine and maxillofacial structures were also  generated.  COMPARISON:  None.  FINDINGS: CT HEAD FINDINGS  The brain has a normal appearance without evidence of atrophy, infarction, mass lesion, hemorrhage, hydrocephalus or extra-axial collection. The calvarium is unremarkable. The paranasal sinuses, middle ears and mastoids are clear.  CT MAXILLOFACIAL FINDINGS  No facial fracture. No traumatic fluid in the sinuses. Chronic nasal septal deviation towards the left.  CT CERVICAL SPINE FINDINGS  No traumatic malalignment. No fracture. No soft tissue swelling. There is facet arthropathy on the right at C4-5 and C5-6. There is facet arthropathy on the left at C4-5, C5-6 and C7-T1. There is mild spondylosis at C4-5, C5-6 and C6-7.  IMPRESSION: Head CT:  Normal.  Facial CT:  Normal.  Cervical spine CT: No traumatic finding. Ordinary  degenerative changes.   Electronically Signed   By: Nelson Chimes M.D.   On: 05/11/2014 09:58   Ct Maxillofacial Wo Cm  05/11/2014   CLINICAL DATA:  [place] at home striking the right-sided face on the floor.  EXAM: CT HEAD WITHOUT CONTRAST  CT MAXILLOFACIAL WITHOUT CONTRAST  CT CERVICAL SPINE WITHOUT CONTRAST  TECHNIQUE: Multidetector CT imaging of the head, cervical spine, and maxillofacial structures were performed using the standard protocol without intravenous contrast. Multiplanar CT image reconstructions of the cervical spine and maxillofacial structures were also generated.  COMPARISON:  None.  FINDINGS: CT HEAD FINDINGS  The brain has a normal appearance without evidence of atrophy, infarction, mass lesion, hemorrhage, hydrocephalus or extra-axial collection. The calvarium is unremarkable. The paranasal sinuses, middle ears and mastoids are clear.  CT MAXILLOFACIAL FINDINGS  No facial fracture. No traumatic fluid in the sinuses. Chronic nasal septal deviation towards the left.  CT CERVICAL SPINE FINDINGS  No traumatic malalignment. No fracture. No soft tissue swelling. There is facet arthropathy on the right at C4-5 and C5-6. There is facet arthropathy on the left at C4-5, C5-6 and C7-T1. There is mild spondylosis at C4-5, C5-6 and C6-7.  IMPRESSION: Head CT:  Normal.  Facial CT:  Normal.  Cervical spine CT: No traumatic finding. Ordinary degenerative changes.   Electronically Signed   By: Nelson Chimes M.D.   On: 05/11/2014 09:58    Microbiology: No results found for this or any previous visit (from the past 240 hour(s)).   Labs: Basic Metabolic Panel:  Recent Labs Lab 05/11/14 1038 05/12/14 0723  NA 138 141  K 3.7 3.6  CL 108 110  CO2 23 25  GLUCOSE 112* 88  BUN 15 8  CREATININE 0.59 0.64  CALCIUM 8.6 8.6   Liver Function Tests: No results for input(s): AST, ALT, ALKPHOS, BILITOT, PROT, ALBUMIN in the last 168 hours. No results for input(s): LIPASE, AMYLASE in the last 168 hours. No  results for input(s): AMMONIA in the last 168 hours. CBC:  Recent Labs Lab 05/11/14 1038 05/12/14 0723  WBC 9.4 7.0  NEUTROABS 7.9*  --   HGB 13.8 12.8  HCT 40.3 38.1  MCV 87.8 88.6  PLT 178 190   Cardiac Enzymes: No results for input(s): CKTOTAL, CKMB, CKMBINDEX, TROPONINI in the last 168 hours. BNP: BNP (last 3 results) No results for input(s): BNP in the last 8760 hours.  ProBNP (last 3 results) No results for input(s): PROBNP in the last 8760 hours.  CBG: No results for input(s): GLUCAP in the last 168 hours.     SignedRadene Gunning  [facility] 05/12/2014, 12:31 PM

## 2014-10-03 ENCOUNTER — Other Ambulatory Visit: Payer: Self-pay | Admitting: Gastroenterology

## 2014-10-03 DIAGNOSIS — R1310 Dysphagia, unspecified: Secondary | ICD-10-CM

## 2014-10-07 ENCOUNTER — Ambulatory Visit
Admission: RE | Admit: 2014-10-07 | Discharge: 2014-10-07 | Disposition: A | Discharge status: Home / Self Care | Payer: BLUE CROSS/BLUE SHIELD | Source: Ambulatory Visit | Attending: Gastroenterology | Admitting: Gastroenterology

## 2014-10-07 DIAGNOSIS — R1310 Dysphagia, unspecified: Secondary | ICD-10-CM

## 2018-02-13 DIAGNOSIS — N765 Ulceration of vagina: Secondary | ICD-10-CM | POA: Diagnosis not present

## 2018-02-20 DIAGNOSIS — N765 Ulceration of vagina: Secondary | ICD-10-CM | POA: Diagnosis not present

## 2018-02-20 DIAGNOSIS — A609 Anogenital herpesviral infection, unspecified: Secondary | ICD-10-CM | POA: Diagnosis not present

## 2018-03-01 DIAGNOSIS — N9089 Other specified noninflammatory disorders of vulva and perineum: Secondary | ICD-10-CM | POA: Diagnosis not present

## 2018-03-07 DIAGNOSIS — Z23 Encounter for immunization: Secondary | ICD-10-CM | POA: Diagnosis not present

## 2018-03-07 DIAGNOSIS — Z6826 Body mass index (BMI) 26.0-26.9, adult: Secondary | ICD-10-CM | POA: Diagnosis not present

## 2018-03-07 DIAGNOSIS — E782 Mixed hyperlipidemia: Secondary | ICD-10-CM | POA: Diagnosis not present

## 2018-03-07 DIAGNOSIS — K219 Gastro-esophageal reflux disease without esophagitis: Secondary | ICD-10-CM | POA: Diagnosis not present

## 2018-03-07 DIAGNOSIS — M2550 Pain in unspecified joint: Secondary | ICD-10-CM | POA: Diagnosis not present

## 2018-03-07 DIAGNOSIS — R030 Elevated blood-pressure reading, without diagnosis of hypertension: Secondary | ICD-10-CM | POA: Diagnosis not present

## 2018-04-16 DIAGNOSIS — J019 Acute sinusitis, unspecified: Secondary | ICD-10-CM | POA: Diagnosis not present

## 2018-04-16 DIAGNOSIS — Z6827 Body mass index (BMI) 27.0-27.9, adult: Secondary | ICD-10-CM | POA: Diagnosis not present

## 2018-06-28 ENCOUNTER — Other Ambulatory Visit: Payer: Self-pay

## 2018-06-28 NOTE — Patient Outreach (Signed)
[facility]) Care Management  06/28/2018  Barbara Wiley 27-Dec-1955 [id number]   Late Entry.  Patient engaged on 05/14/18 to follow up on [employer] [place].Marland Kitchen   HIPAA verified with the patient.   After assessment no further interventions needed.   Patient mailed welcome and successful letter with pamphlet and magnet with 24 hour nurse line. Per Surveyor, quantity patient  placed in engagement program and RN Health coach will follow up in six months.  [name] RN, BSN, [place] Direct Dial:  [phone]  Fax: [phone]

## 2018-07-17 DIAGNOSIS — N950 Postmenopausal bleeding: Secondary | ICD-10-CM | POA: Diagnosis not present

## 2018-08-30 DIAGNOSIS — H6982 Other specified disorders of Eustachian tube, left ear: Secondary | ICD-10-CM | POA: Diagnosis not present

## 2018-08-30 DIAGNOSIS — Z6826 Body mass index (BMI) 26.0-26.9, adult: Secondary | ICD-10-CM | POA: Diagnosis not present

## 2018-09-11 ENCOUNTER — Other Ambulatory Visit: Payer: Self-pay

## 2018-09-11 NOTE — Patient Outreach (Signed)
[facility]) Care Management  09/11/2018  Barbara Wiley Jun 28, 1955 [id number]    Late entry  [place] closing the program.  Patient is transitioning to external program [facility] for continued case management.   [name] RN, BSN, [place] Direct Dial:  [phone]  Fax: [phone]

## 2018-09-17 DIAGNOSIS — R252 Cramp and spasm: Secondary | ICD-10-CM | POA: Diagnosis not present

## 2018-09-17 DIAGNOSIS — Z6826 Body mass index (BMI) 26.0-26.9, adult: Secondary | ICD-10-CM | POA: Diagnosis not present

## 2018-10-02 DIAGNOSIS — N342 Other urethritis: Secondary | ICD-10-CM | POA: Diagnosis not present

## 2018-10-26 DIAGNOSIS — H93292 Other abnormal auditory perceptions, left ear: Secondary | ICD-10-CM | POA: Diagnosis not present

## 2018-10-26 DIAGNOSIS — H9042 Sensorineural hearing loss, unilateral, left ear, with unrestricted hearing on the contralateral side: Secondary | ICD-10-CM | POA: Diagnosis not present

## 2018-10-26 DIAGNOSIS — H938X2 Other specified disorders of left ear: Secondary | ICD-10-CM | POA: Diagnosis not present

## 2018-10-26 DIAGNOSIS — H903 Sensorineural hearing loss, bilateral: Secondary | ICD-10-CM | POA: Diagnosis not present

## 2018-11-13 DIAGNOSIS — N950 Postmenopausal bleeding: Secondary | ICD-10-CM | POA: Diagnosis not present

## 2018-11-22 DIAGNOSIS — N858 Other specified noninflammatory disorders of uterus: Secondary | ICD-10-CM | POA: Diagnosis not present

## 2018-11-22 DIAGNOSIS — N950 Postmenopausal bleeding: Secondary | ICD-10-CM | POA: Diagnosis not present

## 2018-11-23 ENCOUNTER — Ambulatory Visit: Payer: BLUE CROSS/BLUE SHIELD

## 2018-12-19 DIAGNOSIS — Z1231 Encounter for screening mammogram for malignant neoplasm of breast: Secondary | ICD-10-CM | POA: Diagnosis not present

## 2018-12-20 DIAGNOSIS — R310 Gross hematuria: Secondary | ICD-10-CM | POA: Diagnosis not present

## 2018-12-25 DIAGNOSIS — R310 Gross hematuria: Secondary | ICD-10-CM | POA: Diagnosis not present

## 2018-12-25 DIAGNOSIS — N281 Cyst of kidney, acquired: Secondary | ICD-10-CM | POA: Diagnosis not present

## 2019-01-01 DIAGNOSIS — R310 Gross hematuria: Secondary | ICD-10-CM | POA: Diagnosis not present

## 2019-03-11 DIAGNOSIS — Z0000 Encounter for general adult medical examination without abnormal findings: Secondary | ICD-10-CM | POA: Diagnosis not present

## 2019-03-11 DIAGNOSIS — I10 Essential (primary) hypertension: Secondary | ICD-10-CM | POA: Diagnosis not present

## 2019-03-11 DIAGNOSIS — K219 Gastro-esophageal reflux disease without esophagitis: Secondary | ICD-10-CM | POA: Diagnosis not present

## 2019-03-11 DIAGNOSIS — E559 Vitamin D deficiency, unspecified: Secondary | ICD-10-CM | POA: Diagnosis not present

## 2019-03-11 DIAGNOSIS — E782 Mixed hyperlipidemia: Secondary | ICD-10-CM | POA: Diagnosis not present

## 2019-03-13 DIAGNOSIS — Z0001 Encounter for general adult medical examination with abnormal findings: Secondary | ICD-10-CM | POA: Diagnosis not present

## 2019-03-13 DIAGNOSIS — E782 Mixed hyperlipidemia: Secondary | ICD-10-CM | POA: Diagnosis not present

## 2019-03-13 DIAGNOSIS — K219 Gastro-esophageal reflux disease without esophagitis: Secondary | ICD-10-CM | POA: Diagnosis not present

## 2019-03-13 DIAGNOSIS — M2550 Pain in unspecified joint: Secondary | ICD-10-CM | POA: Diagnosis not present

## 2019-03-13 DIAGNOSIS — Z6826 Body mass index (BMI) 26.0-26.9, adult: Secondary | ICD-10-CM | POA: Diagnosis not present

## 2019-03-13 DIAGNOSIS — R030 Elevated blood-pressure reading, without diagnosis of hypertension: Secondary | ICD-10-CM | POA: Diagnosis not present

## 2019-03-13 DIAGNOSIS — R252 Cramp and spasm: Secondary | ICD-10-CM | POA: Diagnosis not present

## 2019-03-13 DIAGNOSIS — Z23 Encounter for immunization: Secondary | ICD-10-CM | POA: Diagnosis not present

## 2019-04-04 DIAGNOSIS — N362 Urethral caruncle: Secondary | ICD-10-CM | POA: Diagnosis not present

## 2019-04-25 DIAGNOSIS — H2513 Age-related nuclear cataract, bilateral: Secondary | ICD-10-CM | POA: Diagnosis not present

## 2019-04-25 DIAGNOSIS — H40013 Open angle with borderline findings, low risk, bilateral: Secondary | ICD-10-CM | POA: Diagnosis not present

## 2019-04-25 DIAGNOSIS — H04123 Dry eye syndrome of bilateral lacrimal glands: Secondary | ICD-10-CM | POA: Diagnosis not present

## 2019-04-25 DIAGNOSIS — H25013 Cortical age-related cataract, bilateral: Secondary | ICD-10-CM | POA: Diagnosis not present

## 2019-07-04 DIAGNOSIS — N362 Urethral caruncle: Secondary | ICD-10-CM | POA: Diagnosis not present

## 2019-08-05 DIAGNOSIS — J309 Allergic rhinitis, unspecified: Secondary | ICD-10-CM | POA: Diagnosis not present

## 2019-08-05 DIAGNOSIS — J019 Acute sinusitis, unspecified: Secondary | ICD-10-CM | POA: Diagnosis not present

## 2019-09-24 ENCOUNTER — Other Ambulatory Visit: Payer: Self-pay

## 2019-09-24 ENCOUNTER — Ambulatory Visit: Payer: PPO | Admitting: Physician Assistant

## 2019-09-24 ENCOUNTER — Encounter: Payer: Self-pay | Admitting: Physician Assistant

## 2019-09-24 DIAGNOSIS — L821 Other seborrheic keratosis: Secondary | ICD-10-CM | POA: Diagnosis not present

## 2019-09-24 DIAGNOSIS — D229 Melanocytic nevi, unspecified: Secondary | ICD-10-CM

## 2019-09-24 DIAGNOSIS — D1800 Hemangioma unspecified site: Secondary | ICD-10-CM | POA: Diagnosis not present

## 2019-09-24 DIAGNOSIS — L578 Other skin changes due to chronic exposure to nonionizing radiation: Secondary | ICD-10-CM | POA: Diagnosis not present

## 2019-09-24 DIAGNOSIS — Z86018 Personal history of other benign neoplasm: Secondary | ICD-10-CM | POA: Diagnosis not present

## 2019-09-24 DIAGNOSIS — L814 Other melanin hyperpigmentation: Secondary | ICD-10-CM

## 2019-09-24 DIAGNOSIS — Z1283 Encounter for screening for malignant neoplasm of skin: Secondary | ICD-10-CM

## 2019-09-24 NOTE — Progress Notes (Signed)
   Follow-Up Visit   Subjective  Barbara Wiley is a 64 y.o. female who presents for the following: Establish Care (skin check--moles different area.).   The following portions of the chart were reviewed this encounter and updated as appropriate:     Objective  Well appearing patient in no apparent distress; mood and affect are within normal limits.  A full examination was performed including scalp, head, eyes, ears, nose, lips, neck, chest, axillae, abdomen, back, buttocks, bilateral upper extremities, bilateral lower extremities, hands, feet, fingers, toes, fingernails, and toenails. All findings within normal limits unless otherwise noted below.  Objective  head to toe: No atypical nevi No signs of non-mole skin cancer.    Assessment & Plan  History of dysplastic nevus Mid Back  Screening exam for skin cancer head to toe  Yearly skin exams  Lentigines - Scattered tan macules - Discussed due to sun exposure - Benign, observe - Call for any changes  Seborrheic Keratoses- scattered - Stuck-on, waxy, tan-brown papules and plaques  - Discussed benign etiology and prognosis. - Observe - Call for any changes  Melanocytic Nevi- right ear - Tan-brown and/or pink-flesh-colored symmetric macules and papules - Benign appearing on exam today - Observation - Call clinic for new or changing moles - Recommend daily use of broad spectrum spf 30+ sunscreen to sun-exposed areas.   Hemangiomas - Red papules - Discussed benign nature - Observe - Call for any changes  Actinic Damage- chest - diffuse scaly erythematous macules with underlying dyspigmentation - Recommend daily broad spectrum sunscreen SPF 30+ to sun-exposed areas, reapply every 2 hours as needed.  - Call for new or changing lesions.  Skin cancer screening performed today.   I, Estus Krakowski, PA-C, have reviewed all documentation's for this visit.  The documentation on 09/24/19 for the exam, diagnosis,  procedures and orders are all accurate and complete.

## 2020-01-06 DIAGNOSIS — N362 Urethral caruncle: Secondary | ICD-10-CM | POA: Diagnosis not present

## 2020-01-14 DIAGNOSIS — J019 Acute sinusitis, unspecified: Secondary | ICD-10-CM | POA: Diagnosis not present

## 2020-01-14 DIAGNOSIS — J069 Acute upper respiratory infection, unspecified: Secondary | ICD-10-CM | POA: Diagnosis not present

## 2020-03-12 DIAGNOSIS — E559 Vitamin D deficiency, unspecified: Secondary | ICD-10-CM | POA: Diagnosis not present

## 2020-03-12 DIAGNOSIS — K219 Gastro-esophageal reflux disease without esophagitis: Secondary | ICD-10-CM | POA: Diagnosis not present

## 2020-03-12 DIAGNOSIS — E7849 Other hyperlipidemia: Secondary | ICD-10-CM | POA: Diagnosis not present

## 2020-03-12 DIAGNOSIS — E782 Mixed hyperlipidemia: Secondary | ICD-10-CM | POA: Diagnosis not present

## 2020-03-12 DIAGNOSIS — I10 Essential (primary) hypertension: Secondary | ICD-10-CM | POA: Diagnosis not present

## 2020-03-16 DIAGNOSIS — I10 Essential (primary) hypertension: Secondary | ICD-10-CM | POA: Diagnosis not present

## 2020-03-16 DIAGNOSIS — N362 Urethral caruncle: Secondary | ICD-10-CM | POA: Diagnosis not present

## 2020-03-16 DIAGNOSIS — F40240 Claustrophobia: Secondary | ICD-10-CM | POA: Diagnosis not present

## 2020-03-16 DIAGNOSIS — G4700 Insomnia, unspecified: Secondary | ICD-10-CM | POA: Diagnosis not present

## 2020-03-16 DIAGNOSIS — N393 Stress incontinence (female) (male): Secondary | ICD-10-CM | POA: Diagnosis not present

## 2020-03-16 DIAGNOSIS — E7849 Other hyperlipidemia: Secondary | ICD-10-CM | POA: Diagnosis not present

## 2020-03-16 DIAGNOSIS — R252 Cramp and spasm: Secondary | ICD-10-CM | POA: Diagnosis not present

## 2020-03-16 DIAGNOSIS — Z0001 Encounter for general adult medical examination with abnormal findings: Secondary | ICD-10-CM | POA: Diagnosis not present

## 2020-03-19 DIAGNOSIS — R5383 Other fatigue: Secondary | ICD-10-CM | POA: Diagnosis not present

## 2020-03-19 DIAGNOSIS — Z124 Encounter for screening for malignant neoplasm of cervix: Secondary | ICD-10-CM | POA: Diagnosis not present

## 2020-03-19 DIAGNOSIS — Z1231 Encounter for screening mammogram for malignant neoplasm of breast: Secondary | ICD-10-CM | POA: Diagnosis not present

## 2020-03-19 DIAGNOSIS — Z6825 Body mass index (BMI) 25.0-25.9, adult: Secondary | ICD-10-CM | POA: Diagnosis not present

## 2020-03-19 DIAGNOSIS — Z01419 Encounter for gynecological examination (general) (routine) without abnormal findings: Secondary | ICD-10-CM | POA: Diagnosis not present

## 2020-03-25 ENCOUNTER — Other Ambulatory Visit: Payer: Self-pay | Admitting: Obstetrics & Gynecology

## 2020-03-25 DIAGNOSIS — R928 Other abnormal and inconclusive findings on diagnostic imaging of breast: Secondary | ICD-10-CM

## 2020-04-08 ENCOUNTER — Ambulatory Visit: Payer: PPO

## 2020-04-08 ENCOUNTER — Other Ambulatory Visit: Payer: Self-pay

## 2020-04-08 ENCOUNTER — Ambulatory Visit
Admission: RE | Admit: 2020-04-08 | Discharge: 2020-04-08 | Disposition: A | Discharge status: Home / Self Care | Payer: PPO | Source: Ambulatory Visit | Attending: Obstetrics & Gynecology | Admitting: Obstetrics & Gynecology

## 2020-04-08 DIAGNOSIS — R928 Other abnormal and inconclusive findings on diagnostic imaging of breast: Secondary | ICD-10-CM | POA: Diagnosis not present

## 2020-04-30 DIAGNOSIS — H04123 Dry eye syndrome of bilateral lacrimal glands: Secondary | ICD-10-CM | POA: Diagnosis not present

## 2020-04-30 DIAGNOSIS — H25013 Cortical age-related cataract, bilateral: Secondary | ICD-10-CM | POA: Diagnosis not present

## 2020-04-30 DIAGNOSIS — H2513 Age-related nuclear cataract, bilateral: Secondary | ICD-10-CM | POA: Diagnosis not present

## 2020-04-30 DIAGNOSIS — D3131 Benign neoplasm of right choroid: Secondary | ICD-10-CM | POA: Diagnosis not present

## 2020-04-30 DIAGNOSIS — H524 Presbyopia: Secondary | ICD-10-CM | POA: Diagnosis not present

## 2020-04-30 DIAGNOSIS — H40013 Open angle with borderline findings, low risk, bilateral: Secondary | ICD-10-CM | POA: Diagnosis not present

## 2020-07-30 DIAGNOSIS — K5900 Constipation, unspecified: Secondary | ICD-10-CM | POA: Diagnosis not present

## 2020-07-30 DIAGNOSIS — Z1211 Encounter for screening for malignant neoplasm of colon: Secondary | ICD-10-CM | POA: Diagnosis not present

## 2020-07-30 DIAGNOSIS — R1310 Dysphagia, unspecified: Secondary | ICD-10-CM | POA: Diagnosis not present

## 2020-07-30 DIAGNOSIS — R1032 Left lower quadrant pain: Secondary | ICD-10-CM | POA: Diagnosis not present

## 2020-07-30 DIAGNOSIS — R140 Abdominal distension (gaseous): Secondary | ICD-10-CM | POA: Diagnosis not present

## 2020-09-23 DIAGNOSIS — K5900 Constipation, unspecified: Secondary | ICD-10-CM | POA: Diagnosis not present

## 2020-09-23 DIAGNOSIS — R112 Nausea with vomiting, unspecified: Secondary | ICD-10-CM | POA: Diagnosis not present

## 2020-09-23 DIAGNOSIS — R140 Abdominal distension (gaseous): Secondary | ICD-10-CM | POA: Diagnosis not present

## 2020-09-23 DIAGNOSIS — R1032 Left lower quadrant pain: Secondary | ICD-10-CM | POA: Diagnosis not present

## 2020-11-24 DIAGNOSIS — Z1211 Encounter for screening for malignant neoplasm of colon: Secondary | ICD-10-CM | POA: Diagnosis not present

## 2020-11-24 DIAGNOSIS — K449 Diaphragmatic hernia without obstruction or gangrene: Secondary | ICD-10-CM | POA: Diagnosis not present

## 2020-11-24 DIAGNOSIS — K222 Esophageal obstruction: Secondary | ICD-10-CM | POA: Diagnosis not present

## 2020-11-24 DIAGNOSIS — R1310 Dysphagia, unspecified: Secondary | ICD-10-CM | POA: Diagnosis not present

## 2021-01-07 DIAGNOSIS — N952 Postmenopausal atrophic vaginitis: Secondary | ICD-10-CM | POA: Diagnosis not present

## 2021-01-07 DIAGNOSIS — N362 Urethral caruncle: Secondary | ICD-10-CM | POA: Diagnosis not present

## 2021-03-15 DIAGNOSIS — Z1329 Encounter for screening for other suspected endocrine disorder: Secondary | ICD-10-CM | POA: Diagnosis not present

## 2021-03-15 DIAGNOSIS — E559 Vitamin D deficiency, unspecified: Secondary | ICD-10-CM | POA: Diagnosis not present

## 2021-03-15 DIAGNOSIS — K219 Gastro-esophageal reflux disease without esophagitis: Secondary | ICD-10-CM | POA: Diagnosis not present

## 2021-03-15 DIAGNOSIS — E782 Mixed hyperlipidemia: Secondary | ICD-10-CM | POA: Diagnosis not present

## 2021-03-15 DIAGNOSIS — E7849 Other hyperlipidemia: Secondary | ICD-10-CM | POA: Diagnosis not present

## 2021-03-15 DIAGNOSIS — I10 Essential (primary) hypertension: Secondary | ICD-10-CM | POA: Diagnosis not present

## 2021-03-16 DIAGNOSIS — Z0001 Encounter for general adult medical examination with abnormal findings: Secondary | ICD-10-CM | POA: Diagnosis not present

## 2021-03-16 DIAGNOSIS — I10 Essential (primary) hypertension: Secondary | ICD-10-CM | POA: Diagnosis not present

## 2021-03-16 DIAGNOSIS — E7849 Other hyperlipidemia: Secondary | ICD-10-CM | POA: Diagnosis not present

## 2021-03-17 DIAGNOSIS — N362 Urethral caruncle: Secondary | ICD-10-CM | POA: Diagnosis not present

## 2021-03-17 DIAGNOSIS — Z0001 Encounter for general adult medical examination with abnormal findings: Secondary | ICD-10-CM | POA: Diagnosis not present

## 2021-03-17 DIAGNOSIS — Z1331 Encounter for screening for depression: Secondary | ICD-10-CM | POA: Diagnosis not present

## 2021-03-17 DIAGNOSIS — N393 Stress incontinence (female) (male): Secondary | ICD-10-CM | POA: Diagnosis not present

## 2021-03-17 DIAGNOSIS — I10 Essential (primary) hypertension: Secondary | ICD-10-CM | POA: Diagnosis not present

## 2021-03-17 DIAGNOSIS — Z1389 Encounter for screening for other disorder: Secondary | ICD-10-CM | POA: Diagnosis not present

## 2021-03-17 DIAGNOSIS — E7849 Other hyperlipidemia: Secondary | ICD-10-CM | POA: Diagnosis not present

## 2021-03-17 DIAGNOSIS — M2550 Pain in unspecified joint: Secondary | ICD-10-CM | POA: Diagnosis not present

## 2021-03-30 DIAGNOSIS — Z1231 Encounter for screening mammogram for malignant neoplasm of breast: Secondary | ICD-10-CM | POA: Diagnosis not present

## 2021-05-04 DIAGNOSIS — H04123 Dry eye syndrome of bilateral lacrimal glands: Secondary | ICD-10-CM | POA: Diagnosis not present

## 2021-05-04 DIAGNOSIS — H25813 Combined forms of age-related cataract, bilateral: Secondary | ICD-10-CM | POA: Diagnosis not present

## 2021-05-04 DIAGNOSIS — D3131 Benign neoplasm of right choroid: Secondary | ICD-10-CM | POA: Diagnosis not present

## 2021-05-04 DIAGNOSIS — H40013 Open angle with borderline findings, low risk, bilateral: Secondary | ICD-10-CM | POA: Diagnosis not present

## 2021-05-04 DIAGNOSIS — H524 Presbyopia: Secondary | ICD-10-CM | POA: Diagnosis not present

## 2021-05-30 ENCOUNTER — Emergency Department (HOSPITAL_COMMUNITY)
Admission: EM | Admit: 2021-05-30 | Discharge: 2021-05-30 | Disposition: A | Discharge status: Home / Self Care | Payer: PPO | Attending: Emergency Medicine | Admitting: Emergency Medicine

## 2021-05-30 ENCOUNTER — Encounter (HOSPITAL_COMMUNITY): Payer: Self-pay | Admitting: Emergency Medicine

## 2021-05-30 ENCOUNTER — Other Ambulatory Visit: Payer: Self-pay

## 2021-05-30 DIAGNOSIS — Z859 Personal history of malignant neoplasm, unspecified: Secondary | ICD-10-CM | POA: Insufficient documentation

## 2021-05-30 DIAGNOSIS — Z7982 Long term (current) use of aspirin: Secondary | ICD-10-CM | POA: Insufficient documentation

## 2021-05-30 DIAGNOSIS — R319 Hematuria, unspecified: Secondary | ICD-10-CM | POA: Diagnosis present

## 2021-05-30 DIAGNOSIS — D72829 Elevated white blood cell count, unspecified: Secondary | ICD-10-CM | POA: Insufficient documentation

## 2021-05-30 DIAGNOSIS — N3001 Acute cystitis with hematuria: Secondary | ICD-10-CM

## 2021-05-30 HISTORY — DX: Gastro-esophageal reflux disease without esophagitis: K21.9

## 2021-05-30 LAB — URINALYSIS, ROUTINE W REFLEX MICROSCOPIC
Bilirubin Urine: NEGATIVE
Glucose, UA: NEGATIVE mg/dL
Ketones, ur: NEGATIVE mg/dL
Nitrite: NEGATIVE
Protein, ur: NEGATIVE mg/dL
Specific Gravity, Urine: 1.003 — ABNORMAL LOW (ref 1.005–1.030)
pH: 7 (ref 5.0–8.0)

## 2021-05-30 MED ORDER — NITROFURANTOIN MONOHYD MACRO 100 MG PO CAPS: 100.0000 mg | ORAL_CAPSULE | Freq: Two times a day (BID) | ORAL | 0 refills | Status: AC

## 2021-05-30 MED ORDER — NITROFURANTOIN MONOHYD MACRO 100 MG PO CAPS
100.0000 mg | ORAL_CAPSULE | Freq: Two times a day (BID) | ORAL | Status: DC
  Administered 2021-05-30: 100 mg via ORAL
  Filled 2021-05-30: qty 1

## 2021-05-30 MED ORDER — FLUCONAZOLE 150 MG PO TABS: 150.0000 mg | ORAL_TABLET | Freq: Once | ORAL | 0 refills | Status: AC

## 2021-05-30 NOTE — ED Provider Notes (Signed)
?[facility] ?Provider Note ? ? ?CSN: [id number] ?Arrival date & time: 05/30/21  1112 ? ?  ? ?History ? ?Chief Complaint  ?Patient presents with  ? Hematuria  ? ? ?Barbara Wiley is a 66 y.o. female. ? ? ?Hematuria ? ? ?This patient is a very pleasant 66 year old female who takes a baby aspirin, she has a history of a small lump in her urethra for which she uses an estradiol cream, she does not have a history of frequent urine infections nor does she has a history of cancer, bladder issues, interstitial cystitis, kidney stones or any other reasons to have hematuria.  Overnight the patient noticed that she was not able to sleep very well, she has been back to the bathroom very frequently and had some brief intermittent dysuria, she has had a very pink-colored urine and some blood appearing on the toilet paper when she wipes.  No abdominal pain no flank pain no fevers or chills, no nausea or vomiting, has not had any medications for this prior to arrival. ? ?Home Medications ?Prior to Admission medications   ?Medication Sig Start Date End Date Taking? Authorizing Provider  ?nitrofurantoin, macrocrystal-monohydrate, (MACROBID) 100 MG capsule Take 1 capsule (100 mg total) by mouth 2 (two) times daily for 7 days. 05/30/21 06/06/21 Yes Noemi Chapel, MD  ?aspirin EC 81 MG tablet Take 81 mg by mouth daily.    [provider]  ?Cholecalciferol (VITAMIN D3) 25 MCG (1000 UT) CAPS Take by mouth.    [provider]  ?estradiol (ESTRACE) 0.1 MG/GM vaginal cream SMARTSIG:Topical 04/04/19   [provider]  ?ibuprofen (ADVIL,MOTRIN) 400 MG tablet Take 1 tablet (400 mg total) by mouth 3 (three) times daily. 05/12/14   Radene Gunning, NP  ?MAGNESIUM OXIDE PO Take 1 tablet by mouth daily.    [provider]  ?Omega-3 Fatty Acids (FISH OIL PO) Take 1 capsule by mouth daily.    [provider]  ?vitamin C (ASCORBIC ACID) 500 MG tablet Take 500 mg by mouth daily.    [provider]  ?   ? ?Allergies    ?Keflex [cephalexin], Other, Sulfa antibiotics, Telithromycin, Amoxicillin, Ceclor [cefaclor], and Orphenadrine-aspirin-caffeine   ? ?Review of Systems   ?Review of Systems  ?Constitutional:  Negative for fever.  ?Gastrointestinal:  Negative for nausea and vomiting.  ?Genitourinary:  Positive for dysuria and hematuria.  ? ?Physical Exam ?Updated Vital Signs ?BP (!) 186/81   Pulse 93   Temp 98.3 ?F (36.8 ?C) (Oral)   Resp 20   Ht 1.6 m ('5\' 3"'$ )   Wt 64.4 kg   SpO2 97%   BMI 25.15 kg/m?  ?Physical Exam ?Vitals and nursing note reviewed.  ?Constitutional:   ?   Appearance: She is well-developed. She is not diaphoretic.  ?HENT:  ?   Head: Normocephalic and atraumatic.  ?Eyes:  ?   General:     ?   Right eye: No discharge.     ?   Left eye: No discharge.  ?   Conjunctiva/sclera: Conjunctivae normal.  ?Pulmonary:  ?   Effort: Pulmonary effort is normal. No respiratory distress.  ?Abdominal:  ?   Tenderness: There is no abdominal tenderness.  ?   Comments: No abdominal tenderness or masses  ?Musculoskeletal:  ?   Comments: No CVA tenderness, no edema  ?Skin: ?   General: Skin is warm and dry.  ?   Findings: No erythema or rash.  ?Neurological:  ?  Mental Status: She is alert.  ?   Coordination: Coordination normal.  ? ? ?ED Results / Procedures / Treatments   ?Labs ?(all labs ordered are listed, but only abnormal results are displayed) ?Labs Reviewed  ?URINALYSIS, ROUTINE W REFLEX MICROSCOPIC - Abnormal; Notable for the following components:  ?    Result Value  ? Color, Urine STRAW (*)   ? Specific Gravity, Urine 1.003 (*)   ? Hgb urine dipstick LARGE (*)   ? Leukocytes,Ua LARGE (*)   ? Bacteria, UA RARE (*)   ? All other components within normal limits  ?URINE CULTURE  ? ? ?EKG ?None ? ?Radiology ?No results found. ? ?Procedures ?Procedures  ? ? ?Medications Ordered in ED ?Medications  ?nitrofurantoin (macrocrystal-monohydrate) (MACROBID) capsule 100 mg (has no administration in  time range)  ? ? ?ED Course/ Medical Decision Making/ A&P ?  ?                        ?Medical Decision Making ?Amount and/or Complexity of Data Reviewed ?Labs: ordered. ? ?Risk ?Prescription drug management. ? ? ?This patient presents to the ED for concern of urinary frequency and some dysuria with hematuria differential diagnosis includes urinary tract infection, hemorrhagic cystitis, kidney stone, mass ? ? ? ?Additional history obtained: ? ?Additional history obtained from electronic medical record ?External records from outside source obtained and reviewed including multiple prior family doctors visits, labs were done back in 2016 and I have reviewed these labs, no renal dysfunction ? ? ?Lab Tests: ? ?I Ordered, and personally interpreted labs.  The pertinent results include: Urinalysis with a culture, it does show some bacteria, positive for leukocytes, positive for red blood cells ? ? ?Imaging Studies ordered: ? ?No imaging indicated at this time ? ? ?Medicines ordered and prescription drug management: ? ?I ordered medication including Macrobid for dysuria and hematuria likely urinary tract infection ?Reevaluation of the patient after these medicines showed that the patient stayed the same ?I have reviewed the patients home medicines and have made adjustments as needed ? ? ?Problem List / ED Course: ? ?Likely UTI, could be other causes but given the likelihood that these are much less likely I think a urinalysis with a culture is enough today, she appears well and is afebrile without a tachycardia ? ? ?Social Determinants of Health: ? ?None ? ? ? ?The patient was informed of the indications for return and the need to follow-up with family doctor if bleeding does not improve, she is agreeable ? ? ? ? ? ? ? ? ? ? ?Final Clinical Impression(s) / ED Diagnoses ?Final diagnoses:  ?Acute cystitis with hematuria  ? ? ?Rx / DC Orders ?ED Discharge Orders   ? ?      Ordered  ?  nitrofurantoin, macrocrystal-monohydrate,  (MACROBID) 100 MG capsule  2 times daily       ? 05/30/21 1217  ? ?  ?  ? ?  ? ? ?  ?Noemi Chapel, MD ?05/30/21 1218 ? ?

## 2021-05-30 NOTE — Discharge Instructions (Signed)
Your symptoms are likely related to a bladder infection.  I prescribed a medication called Macrobid which is taken twice daily for 7 days.  If you are no better within 48 hours you should see your doctor in the office but come back to the emergency department for severe worsening pain fever or vomiting.  You do have a urinary culture which will be completed within 48 hours so if you need a different antibiotic it can be given to you based on the results of those tests.  Your family doctor will need to obtain those studies ?

## 2021-05-30 NOTE — ED Triage Notes (Signed)
Patient c/o blood in urine that started this morning. Per patient she has had light pink urine x3 today. Denies any fevers. Per patient had slight burning x1 but none since. Denies any hx of kidney stones and stated last UTI in the 1980s. Patient states bladder is not "emptying every time she voids." Denies any nausea or vomiting. Patient also c/o headache that started yesterday in which she took tyenol yesterday for it with on slight/brief relief. Headache worse with movement.  ?

## 2021-05-30 NOTE — ED Notes (Signed)
Pt shown to lobby. Verbalized understanding discharge instructions, follow-up, and prescriptions.  All questions answered. In no acute distress.  ? ?

## 2021-05-30 NOTE — ED Notes (Signed)
Pt ambulated to restroom w/o assistance.  Will d/c Pt when she returns to room.  ?

## 2021-06-02 LAB — URINE CULTURE: Culture: 70000 — AB

## 2021-06-03 ENCOUNTER — Telehealth: Payer: Self-pay | Admitting: *Deleted

## 2021-06-03 NOTE — Telephone Encounter (Signed)
Post ED Visit - Positive Culture Follow-up ? ?Culture report reviewed by antimicrobial stewardship pharmacist: ?[place] Team ?'[]'$  Elenor Quinones, Pharm.D. ?'[]'$  Heide Guile, Pharm.D., BCPS AQ-ID ?'[]'$  Parks Neptune, Pharm.D., BCPS ?'[]'$  Alycia Rossetti, Pharm.D., BCPS ?'[]'$  [place], Pharm.D., BCPS, AAHIVP ?'[]'$  Legrand Como, Pharm.D., BCPS, AAHIVP ?'[]'$  Salome Arnt, PharmD, BCPS ?'[]'$  Johnnette Gourd, PharmD, BCPS ?'[]'$  Hughes Better, PharmD, BCPS ?'[]'$  Leeroy Cha, PharmD ?'[]'$  Laqueta Linden, PharmD, BCPS ?'[]'$  Albertina Parr, PharmD ? ?[place] Team ?'[]'$  Leodis Sias, PharmD ?'[]'$  Lindell Spar, PharmD ?'[]'$  Royetta Asal, PharmD ?'[]'$  Graylin Shiver, Rph ?'[]'$  Rema Fendt) Glennon Mac, PharmD ?'[]'$  Arlyn Dunning, PharmD ?'[]'$  Netta Cedars, PharmD ?'[]'$  Dia Sitter, PharmD ?'[]'$  Leone Haven, PharmD ?'[]'$  Gretta Arab, PharmD ?'[]'$  Theodis Shove, PharmD ?'[]'$  Peggyann Juba, PharmD ?'[]'$  Reuel Boom, PharmD ? ? ?Positive urine culture ?Treated with Nitrofurantoin Monohyd Macro, organism sensitive to the same and no further patient follow-up is required at this time.  Reviewed by Pharmacy ? ?Ardeen Fillers ?06/03/2021, 11:26 AM ?  ?

## 2021-06-04 DIAGNOSIS — N952 Postmenopausal atrophic vaginitis: Secondary | ICD-10-CM | POA: Diagnosis not present

## 2021-06-04 DIAGNOSIS — R310 Gross hematuria: Secondary | ICD-10-CM | POA: Diagnosis not present

## 2021-06-04 DIAGNOSIS — N362 Urethral caruncle: Secondary | ICD-10-CM | POA: Diagnosis not present

## 2021-06-11 DIAGNOSIS — R310 Gross hematuria: Secondary | ICD-10-CM | POA: Diagnosis not present

## 2021-06-11 DIAGNOSIS — N200 Calculus of kidney: Secondary | ICD-10-CM | POA: Diagnosis not present

## 2021-06-28 IMAGING — MG MM DIGITAL DIAGNOSTIC UNILAT*R* W/ TOMO W/ CAD
4 series · 4 of 12 positions shown · non-contrast
Comparison: Previous exam(s).

CLINICAL DATA: Patient returns after screening study for evaluation
of possible RIGHT breast asymmetry.

EXAM:
DIGITAL DIAGNOSTIC UNILATERAL RIGHT MAMMOGRAM WITH TOMOSYNTHESIS AND
CAD
TECHNIQUE: Right digital diagnostic mammography and breast tomosynthesis was
performed. The images were evaluated with computer-aided detection.

[R ML synth-2D]
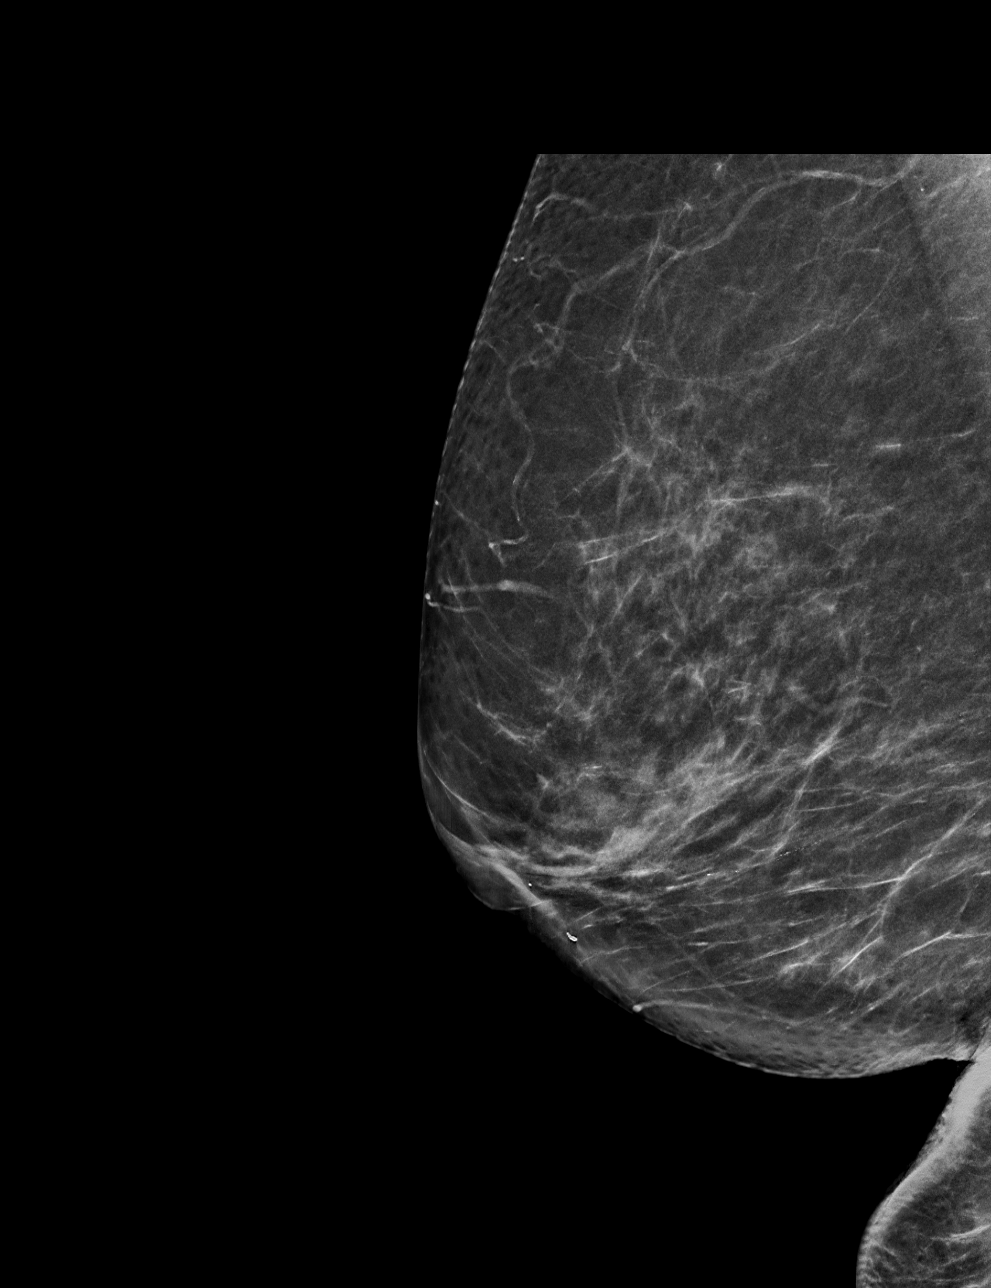

[R CC synth-2D]
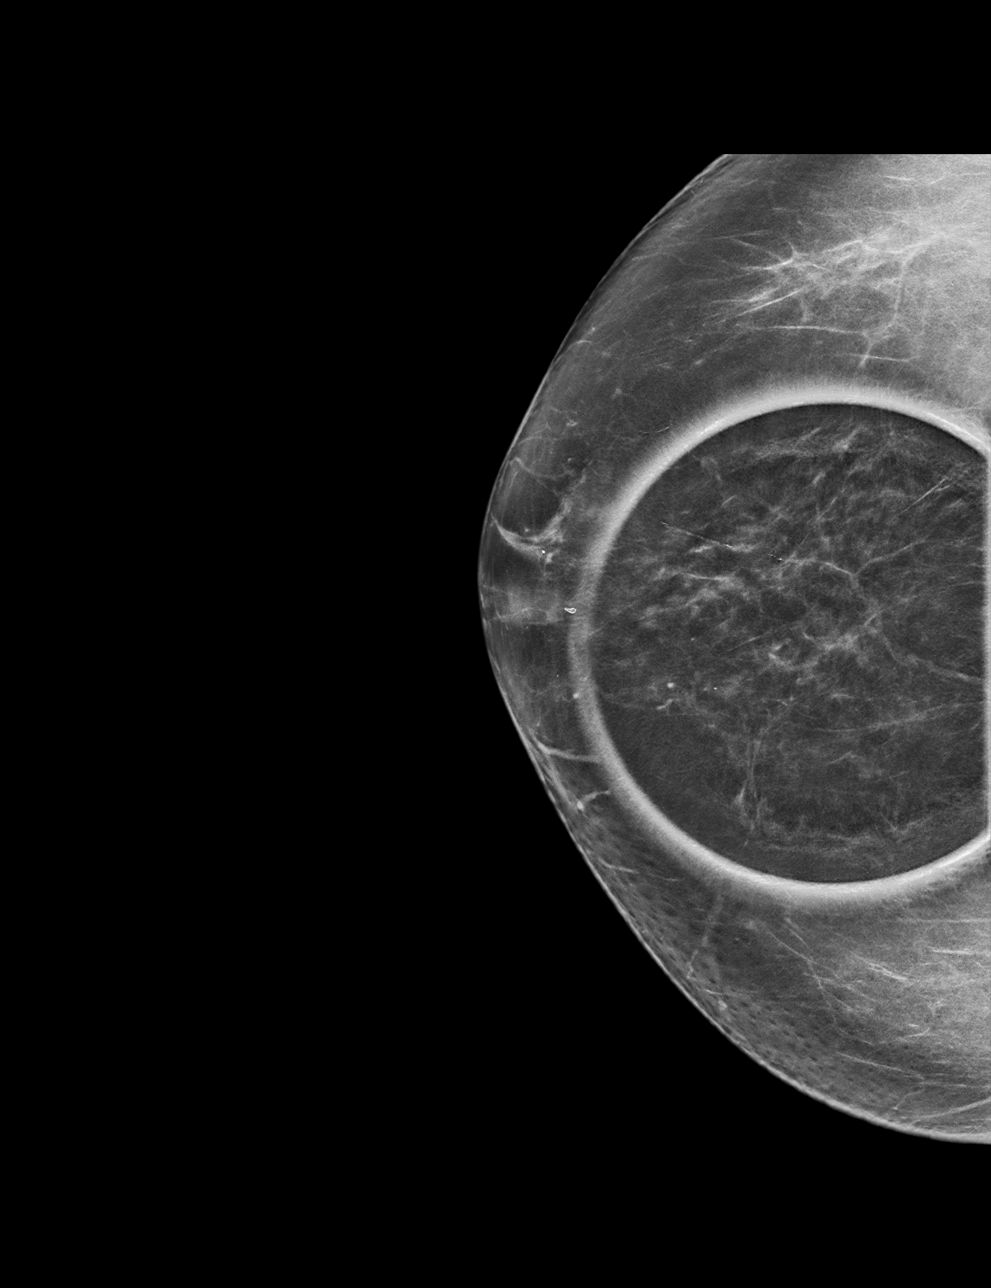

[R CC tomo · tomo slice 33/66.0]
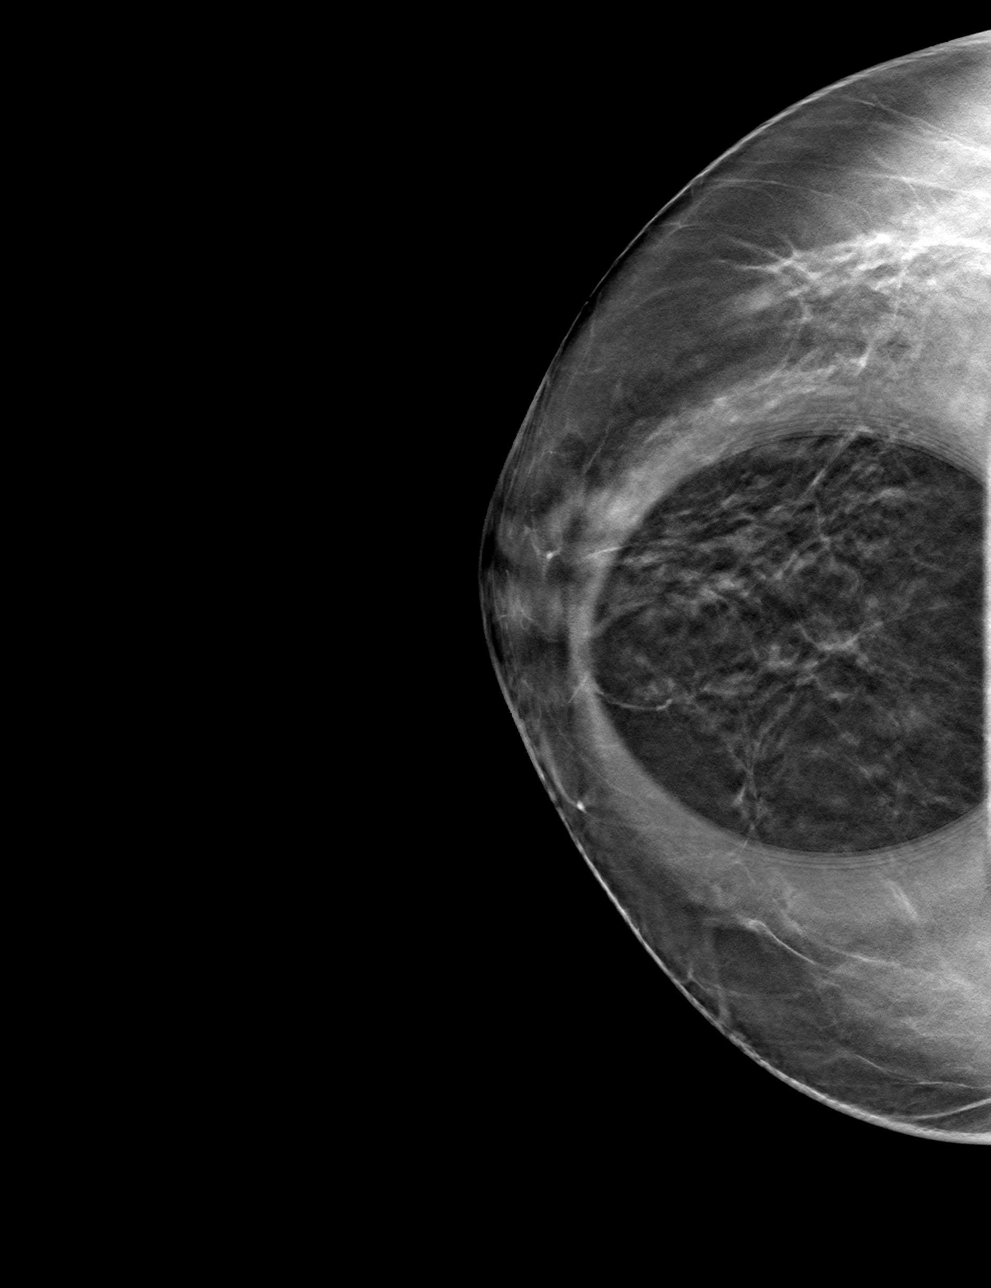

[R ML tomo · tomo slice 33/66.0]
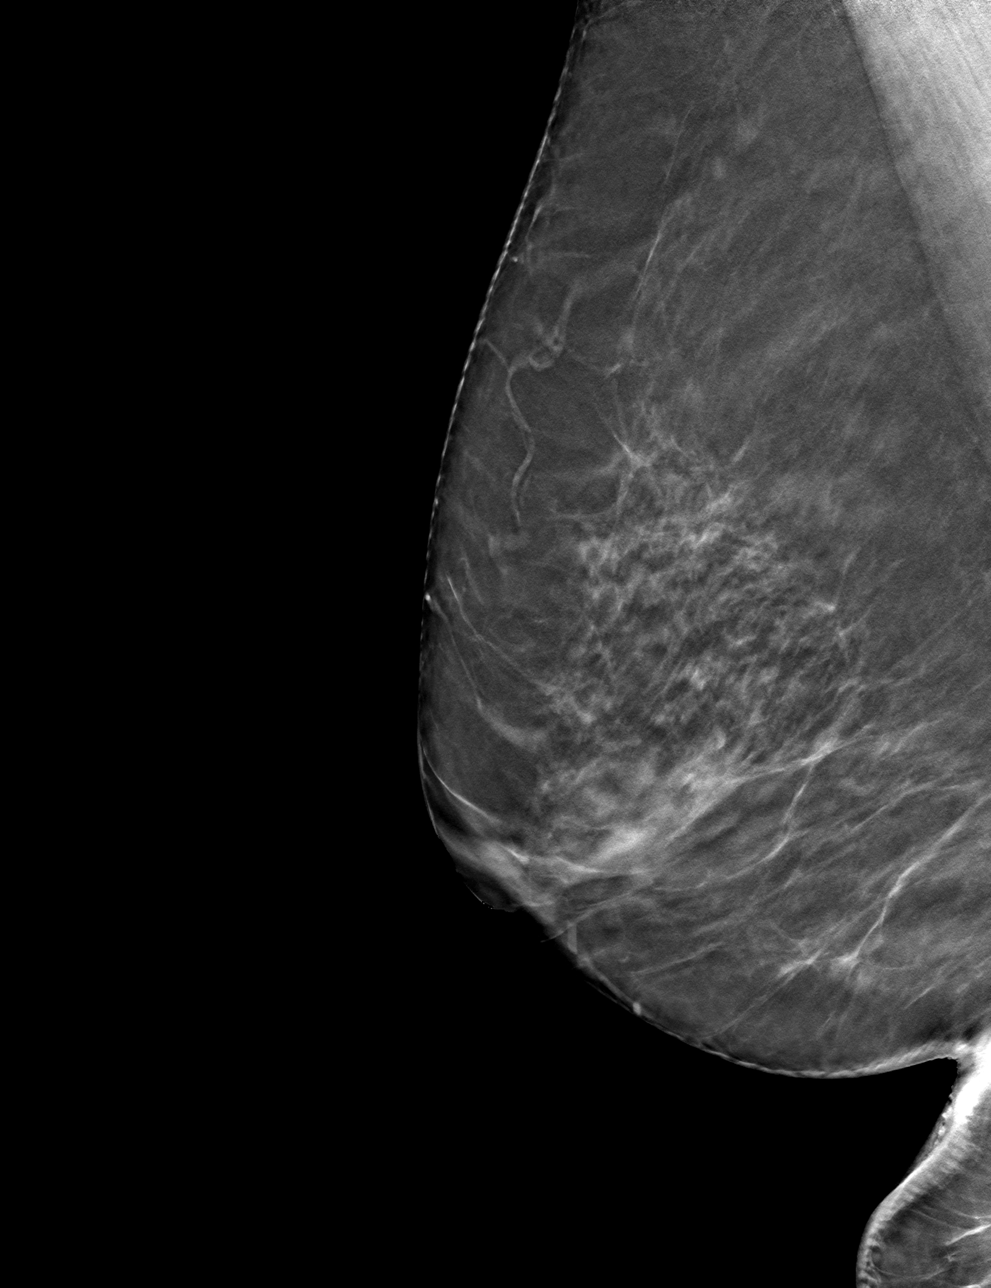

[4 of 12 positions shown; findings below may reference images not displayed]

ACR Breast Density Category c: The breast tissue is heterogeneously
dense, which may obscure small masses.
FINDINGS: Additional 2-D and 3-D images are performed. These views show no
persistent asymmetry or mass in the LOWER central portion of the
RIGHT breast. No suspicious mass, distortion, or microcalcifications
are identified to suggest presence of malignancy.
IMPRESSION: No mammographic evidence for malignancy.

RECOMMENDATION:
Screening mammogram in one year.(Code:[id number]-[id number])

I have discussed the findings and recommendations with the patient.
If applicable, a reminder letter will be sent to the patient
regarding the next appointment.

BI-RADS CATEGORY  1: Negative.

## 2021-07-02 DIAGNOSIS — R310 Gross hematuria: Secondary | ICD-10-CM | POA: Diagnosis not present

## 2021-07-02 DIAGNOSIS — N952 Postmenopausal atrophic vaginitis: Secondary | ICD-10-CM | POA: Diagnosis not present

## 2021-07-20 DIAGNOSIS — H6590 Unspecified nonsuppurative otitis media, unspecified ear: Secondary | ICD-10-CM | POA: Diagnosis not present

## 2021-07-20 DIAGNOSIS — B354 Tinea corporis: Secondary | ICD-10-CM | POA: Diagnosis not present

## 2021-07-20 DIAGNOSIS — Z6825 Body mass index (BMI) 25.0-25.9, adult: Secondary | ICD-10-CM | POA: Diagnosis not present

## 2021-08-03 DIAGNOSIS — R030 Elevated blood-pressure reading, without diagnosis of hypertension: Secondary | ICD-10-CM | POA: Diagnosis not present

## 2021-08-03 DIAGNOSIS — Z7689 Persons encountering health services in other specified circumstances: Secondary | ICD-10-CM | POA: Diagnosis not present

## 2021-08-03 DIAGNOSIS — B354 Tinea corporis: Secondary | ICD-10-CM | POA: Diagnosis not present

## 2021-08-03 DIAGNOSIS — R252 Cramp and spasm: Secondary | ICD-10-CM | POA: Diagnosis not present

## 2021-09-14 DIAGNOSIS — R42 Dizziness and giddiness: Secondary | ICD-10-CM | POA: Diagnosis not present

## 2021-09-14 DIAGNOSIS — Z6825 Body mass index (BMI) 25.0-25.9, adult: Secondary | ICD-10-CM | POA: Diagnosis not present

## 2021-09-14 DIAGNOSIS — R030 Elevated blood-pressure reading, without diagnosis of hypertension: Secondary | ICD-10-CM | POA: Diagnosis not present

## 2021-09-20 DIAGNOSIS — Z9181 History of falling: Secondary | ICD-10-CM | POA: Diagnosis not present

## 2021-09-20 DIAGNOSIS — R42 Dizziness and giddiness: Secondary | ICD-10-CM | POA: Diagnosis not present

## 2021-09-20 DIAGNOSIS — R2689 Other abnormalities of gait and mobility: Secondary | ICD-10-CM | POA: Diagnosis not present

## 2021-09-30 DIAGNOSIS — Z6825 Body mass index (BMI) 25.0-25.9, adult: Secondary | ICD-10-CM | POA: Diagnosis not present

## 2021-09-30 DIAGNOSIS — R030 Elevated blood-pressure reading, without diagnosis of hypertension: Secondary | ICD-10-CM | POA: Diagnosis not present

## 2021-09-30 DIAGNOSIS — H6590 Unspecified nonsuppurative otitis media, unspecified ear: Secondary | ICD-10-CM | POA: Diagnosis not present

## 2021-09-30 DIAGNOSIS — H9202 Otalgia, left ear: Secondary | ICD-10-CM | POA: Diagnosis not present

## 2021-10-13 ENCOUNTER — Ambulatory Visit: Payer: PPO | Admitting: Physician Assistant

## 2021-10-14 DIAGNOSIS — D225 Melanocytic nevi of trunk: Secondary | ICD-10-CM | POA: Diagnosis not present

## 2021-10-14 DIAGNOSIS — Z1283 Encounter for screening for malignant neoplasm of skin: Secondary | ICD-10-CM | POA: Diagnosis not present

## 2021-10-14 DIAGNOSIS — L308 Other specified dermatitis: Secondary | ICD-10-CM | POA: Diagnosis not present

## 2021-11-05 DIAGNOSIS — N362 Urethral caruncle: Secondary | ICD-10-CM | POA: Diagnosis not present

## 2021-11-05 DIAGNOSIS — N200 Calculus of kidney: Secondary | ICD-10-CM | POA: Diagnosis not present

## 2021-12-14 DIAGNOSIS — M791 Myalgia, unspecified site: Secondary | ICD-10-CM | POA: Diagnosis not present

## 2021-12-14 DIAGNOSIS — R0982 Postnasal drip: Secondary | ICD-10-CM | POA: Diagnosis not present

## 2021-12-14 DIAGNOSIS — R252 Cramp and spasm: Secondary | ICD-10-CM | POA: Diagnosis not present

## 2021-12-14 DIAGNOSIS — Z6825 Body mass index (BMI) 25.0-25.9, adult: Secondary | ICD-10-CM | POA: Diagnosis not present

## 2021-12-14 DIAGNOSIS — R030 Elevated blood-pressure reading, without diagnosis of hypertension: Secondary | ICD-10-CM | POA: Diagnosis not present

## 2021-12-14 DIAGNOSIS — J029 Acute pharyngitis, unspecified: Secondary | ICD-10-CM | POA: Diagnosis not present

## 2021-12-15 ENCOUNTER — Ambulatory Visit: Payer: Self-pay

## 2021-12-15 ENCOUNTER — Encounter: Payer: Self-pay | Admitting: Emergency Medicine

## 2021-12-15 ENCOUNTER — Other Ambulatory Visit: Payer: Self-pay

## 2021-12-15 ENCOUNTER — Ambulatory Visit
Admission: EM | Admit: 2021-12-15 | Discharge: 2021-12-15 | Disposition: A | Discharge status: Home / Self Care | Payer: PPO | Attending: Family Medicine | Admitting: Family Medicine

## 2021-12-15 DIAGNOSIS — N362 Urethral caruncle: Secondary | ICD-10-CM | POA: Diagnosis not present

## 2021-12-15 DIAGNOSIS — N952 Postmenopausal atrophic vaginitis: Secondary | ICD-10-CM | POA: Diagnosis not present

## 2021-12-15 DIAGNOSIS — L819 Disorder of pigmentation, unspecified: Secondary | ICD-10-CM

## 2021-12-15 DIAGNOSIS — R030 Elevated blood-pressure reading, without diagnosis of hypertension: Secondary | ICD-10-CM | POA: Diagnosis not present

## 2021-12-15 DIAGNOSIS — R200 Anesthesia of skin: Secondary | ICD-10-CM

## 2021-12-15 MED ORDER — AMLODIPINE BESYLATE 2.5 MG PO TABS: 2.5000 mg | ORAL_TABLET | Freq: Every day | ORAL | 0 refills | Status: DC

## 2021-12-15 NOTE — ED Provider Notes (Signed)
[facility]    CSN: [id number] Arrival date & time: 12/15/21  1539      History   Chief Complaint Chief Complaint  Patient presents with   Numbness    HPI Barbara Wiley is a 66 y.o. female.   Presenting today with several day history of intermittent middle finger purple discoloration, numbness and tingling that last several minutes to several hours at a time.  Initially was only in the left middle finger but that dissipated and now having some intermittent issues with the right middle finger the same.  Denies injuries to the arms, arm pain, decreased range of motion, chest pain, shortness of breath.  So far not trying anything over-the-counter for symptoms.    Past Medical History:  Diagnosis Date   Anxiety    GERD (gastroesophageal reflux disease)    Hyperlipidemia    Hypertension    Whitecoat hypertension    Patient Active Problem List   Diagnosis Date Noted   Syncope and collapse 05/11/2014   Mild concussion 05/11/2014   Facial contusion 05/11/2014   Leg cramps 05/11/2014   Orthostatic dizziness 05/11/2014   HYPERTENSION, UNSPECIFIED 11/12/2008   CHEST PAIN-UNSPECIFIED 11/12/2008    Past Surgical History:  Procedure Laterality Date   ECTOPIC PREGNANCY SURGERY     TUBAL LIGATION      OB History   No obstetric history on file.      Home Medications    Prior to Admission medications   Medication Sig Start Date End Date Taking? Authorizing Provider  amLODipine (NORVASC) 2.5 MG tablet Take 1 tablet (2.5 mg total) by mouth daily. 12/15/21  Yes Volney American, PA-C  aspirin EC 81 MG tablet Take 81 mg by mouth daily.    [provider]  Cholecalciferol (VITAMIN D3) 25 MCG (1000 UT) CAPS Take by mouth.    [provider]  estradiol (ESTRACE) 0.1 MG/GM vaginal cream SMARTSIG:Topical 04/04/19   [provider]  ibuprofen (ADVIL,MOTRIN) 400 MG tablet Take 1 tablet (400 mg total) by mouth 3 (three) times daily.  05/12/14   Black, Lezlie Octave, NP  MAGNESIUM OXIDE PO Take 1 tablet by mouth daily.    [provider]  Omega-3 Fatty Acids (FISH OIL PO) Take 1 capsule by mouth daily.    [provider]  vitamin C (ASCORBIC ACID) 500 MG tablet Take 500 mg by mouth daily.    [provider]    Family History History reviewed. No pertinent family history.  Social History Social History   Tobacco Use   Smoking status: Never   Smokeless tobacco: Never  Substance Use Topics   Alcohol use: No   Drug use: No     Allergies   Keflex [cephalexin], Other, Sulfa antibiotics, Telithromycin, Amoxicillin, Ceclor [cefaclor], and Orphenadrine-aspirin-caffeine   Review of Systems Review of Systems Per HPI  Physical Exam Triage Vital Signs ED Triage Vitals [12/15/21 1634]  Enc Vitals Group     BP (!) 191/91     Pulse Rate (!) 102     Resp 20     Temp 98.5 F (36.9 C)     Temp Source Oral     SpO2 93 %     Weight      Height      Head Circumference      Peak Flow      Pain Score 0     Pain Loc      Pain Edu?      Excl.  in [place]?    No data found.  Updated Vital Signs BP (!) 171/89 (BP Location: Right Arm)   Pulse 89   Temp 98.5 F (36.9 C) (Oral)   Resp 20   SpO2 94%   Visual Acuity Right Eye Distance:   Left Eye Distance:   Bilateral Distance:    Right Eye Near:   Left Eye Near:    Bilateral Near:     Physical Exam Vitals and nursing note reviewed.  Constitutional:      Appearance: Normal appearance. She is not ill-appearing.  HENT:     Head: Atraumatic.     Mouth/Throat:     Mouth: Mucous membranes are moist.  Eyes:     Extraocular Movements: Extraocular movements intact.     Conjunctiva/sclera: Conjunctivae normal.  Cardiovascular:     Rate and Rhythm: Normal rate and regular rhythm.     Heart sounds: Normal heart sounds.  Pulmonary:     Effort: Pulmonary effort is normal.     Breath sounds: Normal breath sounds.  Musculoskeletal:         General: No swelling, tenderness, deformity or signs of injury. Normal range of motion.     Cervical back: Normal range of motion and neck supple.  Skin:    General: Skin is warm and dry.     Comments: Slight purple discoloration of the right middle finger  Neurological:     Mental Status: She is alert and oriented to person, place, and time.     Motor: No weakness.     Gait: Gait normal.     Comments: Bilateral upper extremities neurovascularly intact  Psychiatric:        Mood and Affect: Mood normal.        Thought Content: Thought content normal.        Judgment: Judgment normal.      UC Treatments / Results  Labs (all labs ordered are listed, but only abnormal results are displayed) Labs Reviewed - No data to display  EKG   Radiology No results found.  Procedures Procedures (including critical care time)  Medications Ordered in UC Medications - No data to display  Initial Impression / Assessment and Plan / UC Course  I have reviewed the triage vital signs and the nursing notes.  Pertinent labs & imaging results that were available during my care of the patient were reviewed by me and considered in my medical decision making (see chart for details).     Most consistent with Raynaud's, bilateral upper extremities appear neurovascularly intact and she appears in no distress.  Trial low-dose amlodipine to see if this resolves her symptoms and follow-up closely with PCP for recheck.  Discussed her blood pressure reading at length, she states this is only when she is being seen at the doctors and her home readings are in the 110s to 120/80 range consistently.  Did discuss close monitoring of home readings on the low-dose of amlodipine and monitoring for low readings, though low suspicion for this.  Final Clinical Impressions(s) / UC Diagnoses   Final diagnoses:  Discoloration of skin of finger  Finger numbness  Elevated blood pressure reading   Discharge Instructions    None    ED Prescriptions     Medication Sig Dispense Auth. Provider   amLODipine (NORVASC) 2.5 MG tablet Take 1 tablet (2.5 mg total) by mouth daily. 14 tablet Volney American, [place]      PDMP not reviewed this encounter.   Orene Desanctis,  Lilia Argue, PA-C 12/15/21 1700

## 2021-12-15 NOTE — ED Triage Notes (Signed)
Pt reports left middle finger numbness since Sunday. Pt reports similar symptoms on right hand as well and reports also seen provider for "lower extremity" heaviness yesterday. Pt denies any known injury but reports "not sure if its all related".

## 2021-12-16 DIAGNOSIS — R030 Elevated blood-pressure reading, without diagnosis of hypertension: Secondary | ICD-10-CM | POA: Diagnosis not present

## 2021-12-16 DIAGNOSIS — R200 Anesthesia of skin: Secondary | ICD-10-CM | POA: Diagnosis not present

## 2021-12-16 DIAGNOSIS — Z6825 Body mass index (BMI) 25.0-25.9, adult: Secondary | ICD-10-CM | POA: Diagnosis not present

## 2022-02-16 DIAGNOSIS — M791 Myalgia, unspecified site: Secondary | ICD-10-CM | POA: Diagnosis not present

## 2022-02-16 DIAGNOSIS — R030 Elevated blood-pressure reading, without diagnosis of hypertension: Secondary | ICD-10-CM | POA: Diagnosis not present

## 2022-02-16 DIAGNOSIS — G4700 Insomnia, unspecified: Secondary | ICD-10-CM | POA: Diagnosis not present

## 2022-02-16 DIAGNOSIS — R252 Cramp and spasm: Secondary | ICD-10-CM | POA: Diagnosis not present

## 2022-02-16 DIAGNOSIS — Z6826 Body mass index (BMI) 26.0-26.9, adult: Secondary | ICD-10-CM | POA: Diagnosis not present

## 2022-02-16 DIAGNOSIS — R200 Anesthesia of skin: Secondary | ICD-10-CM | POA: Diagnosis not present

## 2022-02-17 DIAGNOSIS — M62838 Other muscle spasm: Secondary | ICD-10-CM | POA: Diagnosis not present

## 2022-02-17 DIAGNOSIS — M79604 Pain in right leg: Secondary | ICD-10-CM | POA: Diagnosis not present

## 2022-02-17 DIAGNOSIS — M79605 Pain in left leg: Secondary | ICD-10-CM | POA: Diagnosis not present

## 2022-02-17 DIAGNOSIS — Z8739 Personal history of other diseases of the musculoskeletal system and connective tissue: Secondary | ICD-10-CM | POA: Diagnosis not present

## 2022-02-17 DIAGNOSIS — R252 Cramp and spasm: Secondary | ICD-10-CM | POA: Diagnosis not present

## 2022-03-18 DIAGNOSIS — E559 Vitamin D deficiency, unspecified: Secondary | ICD-10-CM | POA: Diagnosis not present

## 2022-03-18 DIAGNOSIS — K219 Gastro-esophageal reflux disease without esophagitis: Secondary | ICD-10-CM | POA: Diagnosis not present

## 2022-03-18 DIAGNOSIS — Z1329 Encounter for screening for other suspected endocrine disorder: Secondary | ICD-10-CM | POA: Diagnosis not present

## 2022-03-18 DIAGNOSIS — E7849 Other hyperlipidemia: Secondary | ICD-10-CM | POA: Diagnosis not present

## 2022-03-18 DIAGNOSIS — Z0001 Encounter for general adult medical examination with abnormal findings: Secondary | ICD-10-CM | POA: Diagnosis not present

## 2022-03-18 DIAGNOSIS — I10 Essential (primary) hypertension: Secondary | ICD-10-CM | POA: Diagnosis not present

## 2022-03-23 DIAGNOSIS — M1380 Other specified arthritis, unspecified site: Secondary | ICD-10-CM | POA: Diagnosis not present

## 2022-03-23 DIAGNOSIS — N393 Stress incontinence (female) (male): Secondary | ICD-10-CM | POA: Diagnosis not present

## 2022-03-23 DIAGNOSIS — I10 Essential (primary) hypertension: Secondary | ICD-10-CM | POA: Diagnosis not present

## 2022-03-23 DIAGNOSIS — G4700 Insomnia, unspecified: Secondary | ICD-10-CM | POA: Diagnosis not present

## 2022-03-23 DIAGNOSIS — E7849 Other hyperlipidemia: Secondary | ICD-10-CM | POA: Diagnosis not present

## 2022-03-23 DIAGNOSIS — F411 Generalized anxiety disorder: Secondary | ICD-10-CM | POA: Diagnosis not present

## 2022-03-23 DIAGNOSIS — M2550 Pain in unspecified joint: Secondary | ICD-10-CM | POA: Diagnosis not present

## 2022-03-23 DIAGNOSIS — Z6825 Body mass index (BMI) 25.0-25.9, adult: Secondary | ICD-10-CM | POA: Diagnosis not present

## 2022-03-23 DIAGNOSIS — Z0001 Encounter for general adult medical examination with abnormal findings: Secondary | ICD-10-CM | POA: Diagnosis not present

## 2022-05-17 DIAGNOSIS — R202 Paresthesia of skin: Secondary | ICD-10-CM | POA: Diagnosis not present

## 2022-05-17 DIAGNOSIS — E663 Overweight: Secondary | ICD-10-CM | POA: Diagnosis not present

## 2022-05-17 DIAGNOSIS — R5383 Other fatigue: Secondary | ICD-10-CM | POA: Diagnosis not present

## 2022-05-17 DIAGNOSIS — M79641 Pain in right hand: Secondary | ICD-10-CM | POA: Diagnosis not present

## 2022-05-17 DIAGNOSIS — M79642 Pain in left hand: Secondary | ICD-10-CM | POA: Diagnosis not present

## 2022-05-17 DIAGNOSIS — Z6825 Body mass index (BMI) 25.0-25.9, adult: Secondary | ICD-10-CM | POA: Diagnosis not present

## 2022-05-19 ENCOUNTER — Other Ambulatory Visit: Payer: Self-pay | Admitting: Family Medicine

## 2022-05-19 DIAGNOSIS — Z1231 Encounter for screening mammogram for malignant neoplasm of breast: Secondary | ICD-10-CM

## 2022-05-20 ENCOUNTER — Ambulatory Visit
Admission: RE | Admit: 2022-05-20 | Discharge: 2022-05-20 | Disposition: A | Discharge status: Home / Self Care | Payer: PPO | Source: Ambulatory Visit | Attending: Family Medicine | Admitting: Family Medicine

## 2022-05-20 DIAGNOSIS — Z1231 Encounter for screening mammogram for malignant neoplasm of breast: Secondary | ICD-10-CM

## 2022-06-14 DIAGNOSIS — E663 Overweight: Secondary | ICD-10-CM | POA: Diagnosis not present

## 2022-06-14 DIAGNOSIS — Z6825 Body mass index (BMI) 25.0-25.9, adult: Secondary | ICD-10-CM | POA: Diagnosis not present

## 2022-06-14 DIAGNOSIS — R5383 Other fatigue: Secondary | ICD-10-CM | POA: Diagnosis not present

## 2022-06-14 DIAGNOSIS — M79641 Pain in right hand: Secondary | ICD-10-CM | POA: Diagnosis not present

## 2022-06-14 DIAGNOSIS — M79642 Pain in left hand: Secondary | ICD-10-CM | POA: Diagnosis not present

## 2022-06-14 DIAGNOSIS — R202 Paresthesia of skin: Secondary | ICD-10-CM | POA: Diagnosis not present

## 2022-06-21 ENCOUNTER — Encounter: Payer: Self-pay | Admitting: Neurology

## 2022-06-28 ENCOUNTER — Encounter: Payer: Self-pay | Admitting: Neurology

## 2022-06-28 ENCOUNTER — Ambulatory Visit: Payer: PPO | Admitting: Neurology

## 2022-06-28 VITALS — BP 173/85 | HR 99 | Ht 63.0 in | Wt 141.0 lb

## 2022-06-28 DIAGNOSIS — R292 Abnormal reflex: Secondary | ICD-10-CM

## 2022-06-28 DIAGNOSIS — R202 Paresthesia of skin: Secondary | ICD-10-CM

## 2022-06-28 NOTE — Patient Instructions (Addendum)
CT cervical spine and lumbar spine without

## 2022-06-28 NOTE — Progress Notes (Signed)
[facility] Neurology Division Clinic Note - Initial Visit   Date: 06/28/2022   Barbara Wiley MRN: [id number] DOB: Dec 16, 1955   Dear Dr. Drenda Freeze:  Thank you for your kind referral of Barbara Wiley for consultation of bilateral leg tingling. Although her history is well known to you, please allow [place] to reiterate it for the purpose of our medical record. The patient was accompanied to the clinic by self.    Barbara Wiley is a 67 y.o. right-handed female with GERD, hypertension, anxiety, and hyperlipidemia presenting for evaluation of paresthesias.   IMPRESSION/PLAN: Generalized paresthesias of the arm and legs Bilateral leg heaviness Raynaud's phenomenon   Neurological exam shows normal sensation and strength.  Reflexes are brisk in the legs, which may indicate canal stenosis.    To further evaluate, I recommend CT cervical and lumbar spine wo contrast. She does not wish to have MRI due to severe claustrophobia.  Further recommendations pending results.   ------------------------------------------------------------- History of present illness: She has a number of issues that she discusses today.   She had a numbness and purple discoloration in the left index finger which lasted in hour in July.  She had similar symptoms in October and November affecting other fingers.    She has history or right carpal tunnel syndrome.  She has episodic sharp wrist pain.  She complains of tingling in the upper arms.  She has leg cramps and heaviness of the right foot.  She has heaviness in the legs.  She stays busy and on her feet at home all day.  Legs are achy at the end of the day.  She has episodic pain in the arch of her feet.  PCP started her on meloxicam and duloxetine which has reduced her tingling.  She also has some low back pain.    Out-side paper records, electronic medical record, and images have been reviewed where available and summarized as:  Lab Results   Component Value Date   VITAMINB12 677 05/11/2014   Lab Results  Component Value Date   TSH 2.420 05/11/2014   No results found for: "ESRSEDRATE", "POCTSEDRATE"  Past Medical History:  Diagnosis Date   Anxiety    GERD (gastroesophageal reflux disease)    Hyperlipidemia    Hypertension    Whitecoat hypertension    Past Surgical History:  Procedure Laterality Date   ECTOPIC PREGNANCY SURGERY     TUBAL LIGATION       Medications:  Outpatient Encounter Medications as of 06/28/2022  Medication Sig   aspirin EC 81 MG tablet Take 81 mg by mouth daily.   Cholecalciferol (VITAMIN D3) 25 MCG (1000 UT) CAPS Take by mouth.   DULoxetine (CYMBALTA) 60 MG capsule Take 60 mg by mouth daily.   ESTRADIOL VA .01% Estradiol Vaginal Cream   Loratadine (CLARITIN PO) Take by mouth.   MAGNESIUM OXIDE PO Take 1 tablet by mouth daily. 250mg  daily   Multiple Vitamin (MULTIVITAMIN) tablet Take 1 tablet by mouth daily.   Omega-3 Fatty Acids (FISH OIL PO) Take 1 capsule by mouth daily.   omeprazole (PRILOSEC) 20 MG capsule Take by mouth.   peppermint oil liquid by Does not apply route as needed.   triamcinolone cream (KENALOG) 0.1 % SMARTSIG:0.5 Gram(s) Topical Twice Daily   vitamin C (ASCORBIC ACID) 500 MG tablet Take 500 mg by mouth daily.   Wheat Dextrin (BENEFIBER DRINK MIX PO) Take by mouth.   [DISCONTINUED] amLODipine (NORVASC) 2.5 MG tablet Take 1 tablet (2.5 mg  total) by mouth daily.   [DISCONTINUED] ibuprofen (ADVIL,MOTRIN) 400 MG tablet Take 1 tablet (400 mg total) by mouth 3 (three) times daily.   No facility-administered encounter medications on file as of 06/28/2022.    Allergies:  Allergies  Allergen Reactions   Keflex [Cephalexin] Nausea Only   Other     duragesic patch causes high heart rate.    Sulfa Antibiotics Nausea Only   Telithromycin Nausea And Vomiting   Amoxicillin Rash   Ceclor [Cefaclor] Rash   Orphenadrine-Aspirin-Caffeine Palpitations    Family  History: Family History  Problem Relation Age of Onset   Heart attack Mother    Alzheimer's disease Father    Crohn's disease Sister    Stroke Brother    Esophageal cancer Maternal Grandmother     Social History: Social History   Tobacco Use   Smoking status: Never   Smokeless tobacco: Never  Substance Use Topics   Alcohol use: No   Drug use: No   Social History   Social History Narrative   Right Handed    Lives in a split level home. Lives with her husband.     Vital Signs:  BP (!) 173/85   Pulse 99   Ht 5\' 3"  (1.6 m)   Wt 141 lb (64 kg)   SpO2 98%   BMI 24.98 kg/m   Neurological Exam: MENTAL STATUS including orientation to time, place, person, recent and remote memory, attention span and concentration, language, and fund of knowledge is normal.  Speech is not dysarthric.  CRANIAL NERVES: II:  No visual field defects.     III-IV-VI: Pupils equal round and reactive to light.  Normal conjugate, extra-ocular eye movements in all directions of gaze.  No nystagmus.  No ptosis.   V:  Normal facial sensation.    VII:  Normal facial symmetry and movements.   VIII:  Normal hearing and vestibular function.   IX-X:  Normal palatal movement.   XI:  Normal shoulder shrug and head rotation.   XII:  Normal tongue strength and range of motion, no deviation or fasciculation.  MOTOR:  No atrophy, fasciculations or abnormal movements.  No pronator drift.   Upper Extremity:  Right  Left  Deltoid  5/5   5/5   Biceps  5/5   5/5   Triceps  5/5   5/5   Wrist extensors  5/5   5/5   Wrist flexors  5/5   5/5   Finger extensors  5/5   5/5   Finger flexors  5/5   5/5   Dorsal interossei  5/5   5/5   Abductor pollicis  5/5   5/5   Tone (Ashworth scale)  0  0   Lower Extremity:  Right  Left  Hip flexors  5/5   5/5   Knee flexors  5/5   5/5   Knee extensors  5/5   5/5   Dorsiflexors  5/5   5/5   Plantarflexors  5/5   5/5   Toe extensors  5/5   5/5   Toe flexors  5/5   5/5    Tone (Ashworth scale)  0  0   MSRs:                                           Right        Left brachioradialis 2+  2+  biceps 2+  2+  triceps 2+  2+  patellar 3+  3+  ankle jerk 2+  2+  Hoffman no  no  plantar response down  down   SENSORY:  Normal and symmetric perception of light touch, pinprick, vibration, and temperature.    COORDINATION/GAIT: Normal finger-to- nose-finger.  Intact rapid alternating movements bilaterally.   Gait narrow based and stable. Tandem and stressed gait intact.      Thank you for allowing me to participate in patient's care.  If I can answer any additional questions, I would be pleased to do so.    Sincerely,    Rylan Kaufmann K. Allena Katz, DO

## 2022-07-04 ENCOUNTER — Ambulatory Visit
Admission: EM | Admit: 2022-07-04 | Discharge: 2022-07-04 | Disposition: A | Discharge status: Home / Self Care | Payer: PPO

## 2022-07-04 DIAGNOSIS — R197 Diarrhea, unspecified: Secondary | ICD-10-CM

## 2022-07-04 NOTE — ED Triage Notes (Signed)
Patient to Urgent Care with complaints of diarrhea that started last Monday. Reports 1-3 episodes every morning. Reports since Tuesday she has had a lot of gas. One further episode of diarrhea yesterday afternoon. Denies any known sick contacts. No other sick family members. Denies any dietary changes.   Has been taking a probiotic to see if that would help. Also eating dry and plain foods. Denies any known fevers.

## 2022-07-04 NOTE — ED Provider Notes (Signed)
Renaldo Fiddler    CSN: [id number] Arrival date & time: 07/04/22  1237      History   Chief Complaint Chief Complaint  Patient presents with   Diarrhea    HPI Barbara Wiley is a 67 y.o. female.  Patient presents with 1 week history of diarrhea.  Her symptoms have been improving.  She had 2 episodes of mostly gas and small amount of liquid stool this morning.  She has small amount of formed stool after these 2 episodes.  She took Imodium this morning.  She denies fever, chills, abdominal pain, blood in stool, nausea, vomiting, dysuria, hematuria, or other symptoms.  She reports having a normal colonoscopy in 2022.  No recent travel or antibiotics.  No family members with similar symptoms.  She denies pertinent medical history.    The history is provided by the patient and medical records.    Past Medical History:  Diagnosis Date   Anxiety    GERD (gastroesophageal reflux disease)    Hyperlipidemia    Hypertension    Whitecoat hypertension    Patient Active Problem List   Diagnosis Date Noted   Syncope and collapse 05/11/2014   Mild concussion 05/11/2014   Facial contusion 05/11/2014   Leg cramps 05/11/2014   Orthostatic dizziness 05/11/2014   HYPERTENSION, UNSPECIFIED 11/12/2008   CHEST PAIN-UNSPECIFIED 11/12/2008    Past Surgical History:  Procedure Laterality Date   ECTOPIC PREGNANCY SURGERY     TUBAL LIGATION      OB History   No obstetric history on file.      Home Medications    Prior to Admission medications   Medication Sig Start Date End Date Taking? Authorizing Provider  aspirin EC 81 MG tablet Take 81 mg by mouth daily.    [provider]  Cholecalciferol (VITAMIN D3) 25 MCG (1000 UT) CAPS Take by mouth.    [provider]  DULoxetine (CYMBALTA) 60 MG capsule Take 60 mg by mouth daily. 05/17/22   [provider]  ESTRADIOL VA .01% Estradiol Vaginal Cream 04/04/19   [provider]  Loratadine (CLARITIN  PO) Take by mouth.    [provider]  MAGNESIUM OXIDE PO Take 1 tablet by mouth daily. 250mg  daily    [provider]  Multiple Vitamin (MULTIVITAMIN) tablet Take 1 tablet by mouth daily.    [provider]  Omega-3 Fatty Acids (FISH OIL PO) Take 1 capsule by mouth daily.    [provider]  omeprazole (PRILOSEC) 20 MG capsule Take by mouth. 10/26/18   [provider]  peppermint oil liquid by Does not apply route as needed.    [provider]  triamcinolone cream (KENALOG) 0.1 % SMARTSIG:0.5 Gram(s) Topical Twice Daily    [provider]  vitamin C (ASCORBIC ACID) 500 MG tablet Take 500 mg by mouth daily.    [provider]  Wheat Dextrin (BENEFIBER DRINK MIX PO) Take by mouth.    [provider]    Family History Family History  Problem Relation Age of Onset   Heart attack Mother    Alzheimer's disease Father    Crohn's disease Sister    Stroke Brother    Esophageal cancer Maternal Grandmother     Social History Social History   Tobacco Use   Smoking status: Never   Smokeless tobacco: Never  Substance Use Topics   Alcohol use: No   Drug use: No     Allergies   Keflex [cephalexin],  Other, Sulfa antibiotics, Telithromycin, Amoxicillin, Ceclor [cefaclor], and Orphenadrine-aspirin-caffeine   Review of Systems Review of Systems  Constitutional:  Negative for chills and fever.  Gastrointestinal:  Positive for diarrhea. Negative for abdominal pain, blood in stool, constipation, nausea and vomiting.  Genitourinary:  Negative for dysuria, flank pain and frequency.     Physical Exam Triage Vital Signs ED Triage Vitals  Enc Vitals Group     BP 07/04/22 1352 (!) 150/84     Pulse Rate 07/04/22 1345 94     Resp 07/04/22 1345 18     Temp 07/04/22 1345 97.6 F (36.4 C)     Temp src --      SpO2 07/04/22 1345 95 %     Weight --      Height --      Head Circumference --      Peak Flow --       Pain Score 07/04/22 1350 0     Pain Loc --      Pain Edu? --      Excl. in GC? --    No data found.  Updated Vital Signs BP (!) 150/84   Pulse 94   Temp 97.6 F (36.4 C)   Resp 18   SpO2 95%   Visual Acuity Right Eye Distance:   Left Eye Distance:   Bilateral Distance:    Right Eye Near:   Left Eye Near:    Bilateral Near:     Physical Exam Vitals and nursing note reviewed.  Constitutional:      General: She is not in acute distress.    Appearance: Normal appearance. She is well-developed. She is not ill-appearing.  HENT:     Mouth/Throat:     Mouth: Mucous membranes are moist.  Cardiovascular:     Rate and Rhythm: Normal rate and regular rhythm.     Heart sounds: Normal heart sounds.  Pulmonary:     Effort: Pulmonary effort is normal. No respiratory distress.     Breath sounds: Normal breath sounds.  Abdominal:     Palpations: Abdomen is soft.     Tenderness: There is no abdominal tenderness. There is no right CVA tenderness, left CVA tenderness, guarding or rebound.  Musculoskeletal:     Cervical back: Neck supple.  Skin:    General: Skin is warm and dry.  Neurological:     Mental Status: She is alert.  Psychiatric:        Mood and Affect: Mood normal.        Behavior: Behavior normal.      UC Treatments / Results  Labs (all labs ordered are listed, but only abnormal results are displayed) Labs Reviewed - No data to display  EKG   Radiology No results found.  Procedures Procedures (including critical care time)  Medications Ordered in UC Medications - No data to display  Initial Impression / Assessment and Plan / UC Course  I have reviewed the triage vital signs and the nursing notes.  Pertinent labs & imaging results that were available during my care of the patient were reviewed by me and considered in my medical decision making (see chart for details).    Diarrhea.  Afebrile and vital signs are stable.  No recent travel or antibiotic  use.  Her symptoms are improving daily.  Discussed symptomatic treatment including clear liquids and diarrhea diet.  ED precautions discussed.  Instructed patient to follow-up with her PCP.  Education provided on diarrhea.  She agrees  to plan of care.  Final Clinical Impressions(s) / UC Diagnoses   Final diagnoses:  Diarrhea, unspecified type     Discharge Instructions      Keep yourself hydrated with clear liquids, such as water and Gatorade.  Follow the diarrhea diet as tolerated.   Go to the emergency department if you have worsening symptoms.    Follow up with your primary care provider.          ED Prescriptions   None    PDMP not reviewed this encounter.   Mickie Bail, NP 07/04/22 1422

## 2022-07-04 NOTE — Discharge Instructions (Addendum)
Keep yourself hydrated with clear liquids, such as water and Gatorade.  Follow the diarrhea diet as tolerated.   Go to the emergency department if you have worsening symptoms.    Follow up with your primary care provider.

## 2022-08-02 ENCOUNTER — Ambulatory Visit
Admission: RE | Admit: 2022-08-02 | Discharge: 2022-08-02 | Disposition: A | Discharge status: Home / Self Care | Payer: PPO | Source: Ambulatory Visit | Attending: Neurology | Admitting: Neurology

## 2022-08-02 DIAGNOSIS — R292 Abnormal reflex: Secondary | ICD-10-CM

## 2022-08-02 DIAGNOSIS — R202 Paresthesia of skin: Secondary | ICD-10-CM

## 2022-08-02 DIAGNOSIS — M419 Scoliosis, unspecified: Secondary | ICD-10-CM | POA: Diagnosis not present

## 2022-08-02 DIAGNOSIS — M47812 Spondylosis without myelopathy or radiculopathy, cervical region: Secondary | ICD-10-CM | POA: Diagnosis not present

## 2022-08-02 DIAGNOSIS — M47816 Spondylosis without myelopathy or radiculopathy, lumbar region: Secondary | ICD-10-CM | POA: Diagnosis not present

## 2022-08-16 ENCOUNTER — Telehealth: Payer: Self-pay | Admitting: Neurology

## 2022-08-16 DIAGNOSIS — M5416 Radiculopathy, lumbar region: Secondary | ICD-10-CM

## 2022-08-16 DIAGNOSIS — M5412 Radiculopathy, cervical region: Secondary | ICD-10-CM

## 2022-08-16 NOTE — Telephone Encounter (Signed)
Pt is calling in to get her results from her CT last month.

## 2022-08-16 NOTE — Telephone Encounter (Signed)
Called patient and informed her of results and recommendations. See result note. Patient would like a referral sent to: [facility] in [place]. [address], [place], [place] [address] Phone: [phone].

## 2022-08-24 DIAGNOSIS — D3131 Benign neoplasm of right choroid: Secondary | ICD-10-CM | POA: Diagnosis not present

## 2022-08-24 DIAGNOSIS — H40013 Open angle with borderline findings, low risk, bilateral: Secondary | ICD-10-CM | POA: Diagnosis not present

## 2022-08-24 DIAGNOSIS — H04123 Dry eye syndrome of bilateral lacrimal glands: Secondary | ICD-10-CM | POA: Diagnosis not present

## 2022-08-24 DIAGNOSIS — H25813 Combined forms of age-related cataract, bilateral: Secondary | ICD-10-CM | POA: Diagnosis not present

## 2022-08-24 DIAGNOSIS — H524 Presbyopia: Secondary | ICD-10-CM | POA: Diagnosis not present

## 2022-09-07 DIAGNOSIS — R203 Hyperesthesia: Secondary | ICD-10-CM | POA: Diagnosis not present

## 2022-09-07 DIAGNOSIS — M5412 Radiculopathy, cervical region: Secondary | ICD-10-CM | POA: Diagnosis not present

## 2022-09-07 DIAGNOSIS — E7849 Other hyperlipidemia: Secondary | ICD-10-CM | POA: Diagnosis not present

## 2022-09-07 DIAGNOSIS — I10 Essential (primary) hypertension: Secondary | ICD-10-CM | POA: Diagnosis not present

## 2022-09-07 DIAGNOSIS — M5416 Radiculopathy, lumbar region: Secondary | ICD-10-CM | POA: Diagnosis not present

## 2022-09-07 DIAGNOSIS — R292 Abnormal reflex: Secondary | ICD-10-CM | POA: Diagnosis not present

## 2022-09-07 DIAGNOSIS — K219 Gastro-esophageal reflux disease without esophagitis: Secondary | ICD-10-CM | POA: Diagnosis not present

## 2022-09-13 DIAGNOSIS — R203 Hyperesthesia: Secondary | ICD-10-CM | POA: Diagnosis not present

## 2022-09-13 DIAGNOSIS — R292 Abnormal reflex: Secondary | ICD-10-CM | POA: Diagnosis not present

## 2022-09-13 DIAGNOSIS — M5412 Radiculopathy, cervical region: Secondary | ICD-10-CM | POA: Diagnosis not present

## 2022-09-13 DIAGNOSIS — M5416 Radiculopathy, lumbar region: Secondary | ICD-10-CM | POA: Diagnosis not present

## 2022-09-14 DIAGNOSIS — Z6826 Body mass index (BMI) 26.0-26.9, adult: Secondary | ICD-10-CM | POA: Diagnosis not present

## 2022-09-14 DIAGNOSIS — M2550 Pain in unspecified joint: Secondary | ICD-10-CM | POA: Diagnosis not present

## 2022-09-14 DIAGNOSIS — G4700 Insomnia, unspecified: Secondary | ICD-10-CM | POA: Diagnosis not present

## 2022-09-14 DIAGNOSIS — E7849 Other hyperlipidemia: Secondary | ICD-10-CM | POA: Diagnosis not present

## 2022-09-14 DIAGNOSIS — I10 Essential (primary) hypertension: Secondary | ICD-10-CM | POA: Diagnosis not present

## 2022-09-14 DIAGNOSIS — M1380 Other specified arthritis, unspecified site: Secondary | ICD-10-CM | POA: Diagnosis not present

## 2022-09-14 DIAGNOSIS — J3489 Other specified disorders of nose and nasal sinuses: Secondary | ICD-10-CM | POA: Diagnosis not present

## 2022-09-15 DIAGNOSIS — M5416 Radiculopathy, lumbar region: Secondary | ICD-10-CM | POA: Diagnosis not present

## 2022-09-15 DIAGNOSIS — M5412 Radiculopathy, cervical region: Secondary | ICD-10-CM | POA: Diagnosis not present

## 2022-09-15 DIAGNOSIS — R203 Hyperesthesia: Secondary | ICD-10-CM | POA: Diagnosis not present

## 2022-09-15 DIAGNOSIS — R292 Abnormal reflex: Secondary | ICD-10-CM | POA: Diagnosis not present

## 2022-09-19 DIAGNOSIS — R292 Abnormal reflex: Secondary | ICD-10-CM | POA: Diagnosis not present

## 2022-09-19 DIAGNOSIS — R203 Hyperesthesia: Secondary | ICD-10-CM | POA: Diagnosis not present

## 2022-09-19 DIAGNOSIS — M5416 Radiculopathy, lumbar region: Secondary | ICD-10-CM | POA: Diagnosis not present

## 2022-09-19 DIAGNOSIS — M5412 Radiculopathy, cervical region: Secondary | ICD-10-CM | POA: Diagnosis not present

## 2022-09-20 DIAGNOSIS — Z6825 Body mass index (BMI) 25.0-25.9, adult: Secondary | ICD-10-CM | POA: Diagnosis not present

## 2022-09-20 DIAGNOSIS — M79641 Pain in right hand: Secondary | ICD-10-CM | POA: Diagnosis not present

## 2022-09-20 DIAGNOSIS — M79642 Pain in left hand: Secondary | ICD-10-CM | POA: Diagnosis not present

## 2022-09-20 DIAGNOSIS — E663 Overweight: Secondary | ICD-10-CM | POA: Diagnosis not present

## 2022-09-20 DIAGNOSIS — R5383 Other fatigue: Secondary | ICD-10-CM | POA: Diagnosis not present

## 2022-09-21 DIAGNOSIS — Z780 Asymptomatic menopausal state: Secondary | ICD-10-CM | POA: Diagnosis not present

## 2022-09-21 DIAGNOSIS — M810 Age-related osteoporosis without current pathological fracture: Secondary | ICD-10-CM | POA: Diagnosis not present

## 2022-09-21 DIAGNOSIS — M85852 Other specified disorders of bone density and structure, left thigh: Secondary | ICD-10-CM | POA: Diagnosis not present

## 2022-09-22 DIAGNOSIS — M5416 Radiculopathy, lumbar region: Secondary | ICD-10-CM | POA: Diagnosis not present

## 2022-09-22 DIAGNOSIS — R292 Abnormal reflex: Secondary | ICD-10-CM | POA: Diagnosis not present

## 2022-09-22 DIAGNOSIS — R203 Hyperesthesia: Secondary | ICD-10-CM | POA: Diagnosis not present

## 2022-09-22 DIAGNOSIS — M5412 Radiculopathy, cervical region: Secondary | ICD-10-CM | POA: Diagnosis not present

## 2022-09-27 DIAGNOSIS — M5412 Radiculopathy, cervical region: Secondary | ICD-10-CM | POA: Diagnosis not present

## 2022-09-27 DIAGNOSIS — R292 Abnormal reflex: Secondary | ICD-10-CM | POA: Diagnosis not present

## 2022-09-27 DIAGNOSIS — R203 Hyperesthesia: Secondary | ICD-10-CM | POA: Diagnosis not present

## 2022-09-27 DIAGNOSIS — M5416 Radiculopathy, lumbar region: Secondary | ICD-10-CM | POA: Diagnosis not present

## 2022-09-29 DIAGNOSIS — M5416 Radiculopathy, lumbar region: Secondary | ICD-10-CM | POA: Diagnosis not present

## 2022-09-29 DIAGNOSIS — R203 Hyperesthesia: Secondary | ICD-10-CM | POA: Diagnosis not present

## 2022-09-29 DIAGNOSIS — M5412 Radiculopathy, cervical region: Secondary | ICD-10-CM | POA: Diagnosis not present

## 2022-09-29 DIAGNOSIS — R292 Abnormal reflex: Secondary | ICD-10-CM | POA: Diagnosis not present

## 2022-10-04 DIAGNOSIS — R203 Hyperesthesia: Secondary | ICD-10-CM | POA: Diagnosis not present

## 2022-10-04 DIAGNOSIS — R292 Abnormal reflex: Secondary | ICD-10-CM | POA: Diagnosis not present

## 2022-10-04 DIAGNOSIS — M5416 Radiculopathy, lumbar region: Secondary | ICD-10-CM | POA: Diagnosis not present

## 2022-10-04 DIAGNOSIS — M5412 Radiculopathy, cervical region: Secondary | ICD-10-CM | POA: Diagnosis not present

## 2022-10-06 DIAGNOSIS — M5412 Radiculopathy, cervical region: Secondary | ICD-10-CM | POA: Diagnosis not present

## 2022-10-06 DIAGNOSIS — R292 Abnormal reflex: Secondary | ICD-10-CM | POA: Diagnosis not present

## 2022-10-06 DIAGNOSIS — R203 Hyperesthesia: Secondary | ICD-10-CM | POA: Diagnosis not present

## 2022-10-06 DIAGNOSIS — M5416 Radiculopathy, lumbar region: Secondary | ICD-10-CM | POA: Diagnosis not present

## 2022-10-11 DIAGNOSIS — M5416 Radiculopathy, lumbar region: Secondary | ICD-10-CM | POA: Diagnosis not present

## 2022-10-11 DIAGNOSIS — R292 Abnormal reflex: Secondary | ICD-10-CM | POA: Diagnosis not present

## 2022-10-11 DIAGNOSIS — M5412 Radiculopathy, cervical region: Secondary | ICD-10-CM | POA: Diagnosis not present

## 2022-10-11 DIAGNOSIS — R203 Hyperesthesia: Secondary | ICD-10-CM | POA: Diagnosis not present

## 2022-10-13 DIAGNOSIS — R203 Hyperesthesia: Secondary | ICD-10-CM | POA: Diagnosis not present

## 2022-10-13 DIAGNOSIS — M5412 Radiculopathy, cervical region: Secondary | ICD-10-CM | POA: Diagnosis not present

## 2022-10-13 DIAGNOSIS — R292 Abnormal reflex: Secondary | ICD-10-CM | POA: Diagnosis not present

## 2022-10-13 DIAGNOSIS — M5416 Radiculopathy, lumbar region: Secondary | ICD-10-CM | POA: Diagnosis not present

## 2022-10-17 DIAGNOSIS — R203 Hyperesthesia: Secondary | ICD-10-CM | POA: Diagnosis not present

## 2022-10-17 DIAGNOSIS — M5412 Radiculopathy, cervical region: Secondary | ICD-10-CM | POA: Diagnosis not present

## 2022-10-17 DIAGNOSIS — M5416 Radiculopathy, lumbar region: Secondary | ICD-10-CM | POA: Diagnosis not present

## 2022-10-17 DIAGNOSIS — R292 Abnormal reflex: Secondary | ICD-10-CM | POA: Diagnosis not present

## 2022-10-20 DIAGNOSIS — M5416 Radiculopathy, lumbar region: Secondary | ICD-10-CM | POA: Diagnosis not present

## 2022-10-20 DIAGNOSIS — R203 Hyperesthesia: Secondary | ICD-10-CM | POA: Diagnosis not present

## 2022-10-20 DIAGNOSIS — M5412 Radiculopathy, cervical region: Secondary | ICD-10-CM | POA: Diagnosis not present

## 2022-10-20 DIAGNOSIS — R292 Abnormal reflex: Secondary | ICD-10-CM | POA: Diagnosis not present

## 2022-10-24 DIAGNOSIS — M5412 Radiculopathy, cervical region: Secondary | ICD-10-CM | POA: Diagnosis not present

## 2022-10-24 DIAGNOSIS — M5416 Radiculopathy, lumbar region: Secondary | ICD-10-CM | POA: Diagnosis not present

## 2022-10-24 DIAGNOSIS — R203 Hyperesthesia: Secondary | ICD-10-CM | POA: Diagnosis not present

## 2022-10-24 DIAGNOSIS — R292 Abnormal reflex: Secondary | ICD-10-CM | POA: Diagnosis not present

## 2022-10-27 DIAGNOSIS — R203 Hyperesthesia: Secondary | ICD-10-CM | POA: Diagnosis not present

## 2022-10-27 DIAGNOSIS — M5416 Radiculopathy, lumbar region: Secondary | ICD-10-CM | POA: Diagnosis not present

## 2022-10-27 DIAGNOSIS — R292 Abnormal reflex: Secondary | ICD-10-CM | POA: Diagnosis not present

## 2022-10-27 DIAGNOSIS — M5412 Radiculopathy, cervical region: Secondary | ICD-10-CM | POA: Diagnosis not present

## 2022-11-01 DIAGNOSIS — R292 Abnormal reflex: Secondary | ICD-10-CM | POA: Diagnosis not present

## 2022-11-01 DIAGNOSIS — M5412 Radiculopathy, cervical region: Secondary | ICD-10-CM | POA: Diagnosis not present

## 2022-11-01 DIAGNOSIS — M5416 Radiculopathy, lumbar region: Secondary | ICD-10-CM | POA: Diagnosis not present

## 2022-11-01 DIAGNOSIS — R203 Hyperesthesia: Secondary | ICD-10-CM | POA: Diagnosis not present

## 2022-11-08 DIAGNOSIS — R203 Hyperesthesia: Secondary | ICD-10-CM | POA: Diagnosis not present

## 2022-11-08 DIAGNOSIS — R292 Abnormal reflex: Secondary | ICD-10-CM | POA: Diagnosis not present

## 2022-11-08 DIAGNOSIS — M5416 Radiculopathy, lumbar region: Secondary | ICD-10-CM | POA: Diagnosis not present

## 2022-11-08 DIAGNOSIS — M5412 Radiculopathy, cervical region: Secondary | ICD-10-CM | POA: Diagnosis not present

## 2022-11-15 DIAGNOSIS — M5416 Radiculopathy, lumbar region: Secondary | ICD-10-CM | POA: Diagnosis not present

## 2022-11-15 DIAGNOSIS — R292 Abnormal reflex: Secondary | ICD-10-CM | POA: Diagnosis not present

## 2022-11-15 DIAGNOSIS — R203 Hyperesthesia: Secondary | ICD-10-CM | POA: Diagnosis not present

## 2022-11-15 DIAGNOSIS — M5412 Radiculopathy, cervical region: Secondary | ICD-10-CM | POA: Diagnosis not present

## 2022-11-23 DIAGNOSIS — M5412 Radiculopathy, cervical region: Secondary | ICD-10-CM | POA: Diagnosis not present

## 2022-11-23 DIAGNOSIS — R203 Hyperesthesia: Secondary | ICD-10-CM | POA: Diagnosis not present

## 2022-11-23 DIAGNOSIS — R292 Abnormal reflex: Secondary | ICD-10-CM | POA: Diagnosis not present

## 2022-11-23 DIAGNOSIS — M5416 Radiculopathy, lumbar region: Secondary | ICD-10-CM | POA: Diagnosis not present

## 2022-11-30 DIAGNOSIS — R292 Abnormal reflex: Secondary | ICD-10-CM | POA: Diagnosis not present

## 2022-11-30 DIAGNOSIS — M5416 Radiculopathy, lumbar region: Secondary | ICD-10-CM | POA: Diagnosis not present

## 2022-11-30 DIAGNOSIS — R203 Hyperesthesia: Secondary | ICD-10-CM | POA: Diagnosis not present

## 2022-11-30 DIAGNOSIS — M5412 Radiculopathy, cervical region: Secondary | ICD-10-CM | POA: Diagnosis not present

## 2022-12-23 DIAGNOSIS — N362 Urethral caruncle: Secondary | ICD-10-CM | POA: Diagnosis not present

## 2022-12-23 DIAGNOSIS — N952 Postmenopausal atrophic vaginitis: Secondary | ICD-10-CM | POA: Diagnosis not present

## 2023-03-23 DIAGNOSIS — E782 Mixed hyperlipidemia: Secondary | ICD-10-CM | POA: Diagnosis not present

## 2023-03-23 DIAGNOSIS — E7849 Other hyperlipidemia: Secondary | ICD-10-CM | POA: Diagnosis not present

## 2023-03-23 DIAGNOSIS — Z0001 Encounter for general adult medical examination with abnormal findings: Secondary | ICD-10-CM | POA: Diagnosis not present

## 2023-03-23 DIAGNOSIS — Z1329 Encounter for screening for other suspected endocrine disorder: Secondary | ICD-10-CM | POA: Diagnosis not present

## 2023-03-23 DIAGNOSIS — I10 Essential (primary) hypertension: Secondary | ICD-10-CM | POA: Diagnosis not present

## 2023-03-29 DIAGNOSIS — K219 Gastro-esophageal reflux disease without esophagitis: Secondary | ICD-10-CM | POA: Diagnosis not present

## 2023-03-29 DIAGNOSIS — I10 Essential (primary) hypertension: Secondary | ICD-10-CM | POA: Diagnosis not present

## 2023-03-29 DIAGNOSIS — M1380 Other specified arthritis, unspecified site: Secondary | ICD-10-CM | POA: Diagnosis not present

## 2023-03-29 DIAGNOSIS — D559 Anemia due to enzyme disorder, unspecified: Secondary | ICD-10-CM | POA: Diagnosis not present

## 2023-03-29 DIAGNOSIS — Z0001 Encounter for general adult medical examination with abnormal findings: Secondary | ICD-10-CM | POA: Diagnosis not present

## 2023-03-29 DIAGNOSIS — E782 Mixed hyperlipidemia: Secondary | ICD-10-CM | POA: Diagnosis not present

## 2023-03-29 DIAGNOSIS — Z6826 Body mass index (BMI) 26.0-26.9, adult: Secondary | ICD-10-CM | POA: Diagnosis not present

## 2023-03-29 DIAGNOSIS — D519 Vitamin B12 deficiency anemia, unspecified: Secondary | ICD-10-CM | POA: Diagnosis not present

## 2023-03-29 DIAGNOSIS — M8580 Other specified disorders of bone density and structure, unspecified site: Secondary | ICD-10-CM | POA: Diagnosis not present

## 2023-03-31 DIAGNOSIS — M79641 Pain in right hand: Secondary | ICD-10-CM | POA: Diagnosis not present

## 2023-03-31 DIAGNOSIS — R5383 Other fatigue: Secondary | ICD-10-CM | POA: Diagnosis not present

## 2023-03-31 DIAGNOSIS — M79642 Pain in left hand: Secondary | ICD-10-CM | POA: Diagnosis not present

## 2023-03-31 DIAGNOSIS — Z6825 Body mass index (BMI) 25.0-25.9, adult: Secondary | ICD-10-CM | POA: Diagnosis not present

## 2023-03-31 DIAGNOSIS — E663 Overweight: Secondary | ICD-10-CM | POA: Diagnosis not present

## 2023-04-03 DIAGNOSIS — R030 Elevated blood-pressure reading, without diagnosis of hypertension: Secondary | ICD-10-CM | POA: Diagnosis not present

## 2023-04-03 DIAGNOSIS — Z0001 Encounter for general adult medical examination with abnormal findings: Secondary | ICD-10-CM | POA: Diagnosis not present

## 2023-04-03 DIAGNOSIS — Z6826 Body mass index (BMI) 26.0-26.9, adult: Secondary | ICD-10-CM | POA: Diagnosis not present

## 2023-04-06 ENCOUNTER — Other Ambulatory Visit: Payer: Self-pay | Admitting: Family Medicine

## 2023-04-06 DIAGNOSIS — Z0000 Encounter for general adult medical examination without abnormal findings: Secondary | ICD-10-CM

## 2023-05-22 ENCOUNTER — Ambulatory Visit
Admission: RE | Admit: 2023-05-22 | Discharge: 2023-05-22 | Disposition: A | Discharge status: Home / Self Care | Payer: PPO | Source: Ambulatory Visit | Attending: Family Medicine | Admitting: Family Medicine

## 2023-05-22 DIAGNOSIS — Z1231 Encounter for screening mammogram for malignant neoplasm of breast: Secondary | ICD-10-CM | POA: Diagnosis not present

## 2023-05-22 DIAGNOSIS — Z0000 Encounter for general adult medical examination without abnormal findings: Secondary | ICD-10-CM

## 2023-06-14 DIAGNOSIS — E7849 Other hyperlipidemia: Secondary | ICD-10-CM | POA: Diagnosis not present

## 2023-06-14 DIAGNOSIS — Z131 Encounter for screening for diabetes mellitus: Secondary | ICD-10-CM | POA: Diagnosis not present

## 2023-06-21 DIAGNOSIS — I10 Essential (primary) hypertension: Secondary | ICD-10-CM | POA: Diagnosis not present

## 2023-06-21 DIAGNOSIS — M2550 Pain in unspecified joint: Secondary | ICD-10-CM | POA: Diagnosis not present

## 2023-06-21 DIAGNOSIS — R252 Cramp and spasm: Secondary | ICD-10-CM | POA: Diagnosis not present

## 2023-06-21 DIAGNOSIS — M1380 Other specified arthritis, unspecified site: Secondary | ICD-10-CM | POA: Diagnosis not present

## 2023-07-01 DIAGNOSIS — M1711 Unilateral primary osteoarthritis, right knee: Secondary | ICD-10-CM | POA: Diagnosis not present

## 2023-07-01 DIAGNOSIS — I10 Essential (primary) hypertension: Secondary | ICD-10-CM | POA: Diagnosis not present

## 2023-07-01 DIAGNOSIS — E782 Mixed hyperlipidemia: Secondary | ICD-10-CM | POA: Diagnosis not present

## 2023-07-01 DIAGNOSIS — Z6825 Body mass index (BMI) 25.0-25.9, adult: Secondary | ICD-10-CM | POA: Diagnosis not present

## 2023-07-01 DIAGNOSIS — M1380 Other specified arthritis, unspecified site: Secondary | ICD-10-CM | POA: Diagnosis not present

## 2023-08-25 DIAGNOSIS — H25813 Combined forms of age-related cataract, bilateral: Secondary | ICD-10-CM | POA: Diagnosis not present

## 2023-08-25 DIAGNOSIS — H524 Presbyopia: Secondary | ICD-10-CM | POA: Diagnosis not present

## 2023-08-25 DIAGNOSIS — H40013 Open angle with borderline findings, low risk, bilateral: Secondary | ICD-10-CM | POA: Diagnosis not present

## 2023-08-25 DIAGNOSIS — D3131 Benign neoplasm of right choroid: Secondary | ICD-10-CM | POA: Diagnosis not present

## 2023-09-28 DIAGNOSIS — M25561 Pain in right knee: Secondary | ICD-10-CM | POA: Diagnosis not present

## 2023-09-28 DIAGNOSIS — M79642 Pain in left hand: Secondary | ICD-10-CM | POA: Diagnosis not present

## 2023-09-28 DIAGNOSIS — M79641 Pain in right hand: Secondary | ICD-10-CM | POA: Diagnosis not present

## 2023-09-28 DIAGNOSIS — R5383 Other fatigue: Secondary | ICD-10-CM | POA: Diagnosis not present

## 2023-09-28 DIAGNOSIS — E663 Overweight: Secondary | ICD-10-CM | POA: Diagnosis not present

## 2023-09-28 DIAGNOSIS — Z6825 Body mass index (BMI) 25.0-25.9, adult: Secondary | ICD-10-CM | POA: Diagnosis not present

## 2023-12-20 DIAGNOSIS — Z1329 Encounter for screening for other suspected endocrine disorder: Secondary | ICD-10-CM | POA: Diagnosis not present

## 2023-12-20 DIAGNOSIS — D559 Anemia due to enzyme disorder, unspecified: Secondary | ICD-10-CM | POA: Diagnosis not present

## 2023-12-20 DIAGNOSIS — R42 Dizziness and giddiness: Secondary | ICD-10-CM | POA: Diagnosis not present

## 2023-12-20 DIAGNOSIS — E559 Vitamin D deficiency, unspecified: Secondary | ICD-10-CM | POA: Diagnosis not present

## 2023-12-20 DIAGNOSIS — I10 Essential (primary) hypertension: Secondary | ICD-10-CM | POA: Diagnosis not present

## 2023-12-20 DIAGNOSIS — Z0001 Encounter for general adult medical examination with abnormal findings: Secondary | ICD-10-CM | POA: Diagnosis not present

## 2023-12-20 DIAGNOSIS — E7849 Other hyperlipidemia: Secondary | ICD-10-CM | POA: Diagnosis not present

## 2023-12-22 DIAGNOSIS — N952 Postmenopausal atrophic vaginitis: Secondary | ICD-10-CM | POA: Diagnosis not present

## 2023-12-22 DIAGNOSIS — N362 Urethral caruncle: Secondary | ICD-10-CM | POA: Diagnosis not present

## 2023-12-25 DIAGNOSIS — M1711 Unilateral primary osteoarthritis, right knee: Secondary | ICD-10-CM | POA: Diagnosis not present

## 2023-12-25 DIAGNOSIS — Z6826 Body mass index (BMI) 26.0-26.9, adult: Secondary | ICD-10-CM | POA: Diagnosis not present

## 2023-12-25 DIAGNOSIS — M7121 Synovial cyst of popliteal space [Baker], right knee: Secondary | ICD-10-CM | POA: Diagnosis not present

## 2023-12-25 DIAGNOSIS — M1380 Other specified arthritis, unspecified site: Secondary | ICD-10-CM | POA: Diagnosis not present

## 2023-12-25 DIAGNOSIS — F411 Generalized anxiety disorder: Secondary | ICD-10-CM | POA: Diagnosis not present

## 2023-12-25 DIAGNOSIS — I10 Essential (primary) hypertension: Secondary | ICD-10-CM | POA: Diagnosis not present
